# Patient Record
Sex: Female | Born: 1970 | Race: Black or African American | Hispanic: No | Marital: Single | Smoking: Former smoker
Health system: Southern US, Community
[De-identification: ages and names within clinical notes are randomized; demographics above are authoritative.]

## PROBLEM LIST (undated history)

## (undated) DIAGNOSIS — I10 Essential (primary) hypertension: Secondary | ICD-10-CM

## (undated) DIAGNOSIS — E119 Type 2 diabetes mellitus without complications: Secondary | ICD-10-CM

## (undated) DIAGNOSIS — M545 Low back pain, unspecified: Secondary | ICD-10-CM

## (undated) DIAGNOSIS — E876 Hypokalemia: Secondary | ICD-10-CM

## (undated) DIAGNOSIS — E7800 Pure hypercholesterolemia, unspecified: Secondary | ICD-10-CM

## (undated) HISTORY — PX: CHOLECYSTECTOMY: SHX55

## (undated) HISTORY — PX: KNEE SURGERY: SHX244

## (undated) HISTORY — DX: Low back pain, unspecified: M54.50

## (undated) HISTORY — PX: SPINE SURGERY: SHX786

## (undated) HISTORY — PX: NECK SURGERY: SHX720

## (undated) HISTORY — PX: ANKLE SURGERY: SHX546

---

## 2012-08-05 ENCOUNTER — Emergency Department (HOSPITAL_BASED_OUTPATIENT_CLINIC_OR_DEPARTMENT_OTHER)
Admission: EM | Admit: 2012-08-05 | Discharge: 2012-08-05 | Disposition: A | Discharge status: Home / Self Care | Payer: BC Managed Care – PPO | Attending: Emergency Medicine | Admitting: Emergency Medicine

## 2012-08-05 ENCOUNTER — Encounter (HOSPITAL_BASED_OUTPATIENT_CLINIC_OR_DEPARTMENT_OTHER): Payer: Self-pay

## 2012-08-05 DIAGNOSIS — Z880 Allergy status to penicillin: Secondary | ICD-10-CM | POA: Insufficient documentation

## 2012-08-05 DIAGNOSIS — F17200 Nicotine dependence, unspecified, uncomplicated: Secondary | ICD-10-CM | POA: Insufficient documentation

## 2012-08-05 DIAGNOSIS — I10 Essential (primary) hypertension: Secondary | ICD-10-CM

## 2012-08-05 DIAGNOSIS — F1010 Alcohol abuse, uncomplicated: Secondary | ICD-10-CM | POA: Insufficient documentation

## 2012-08-05 HISTORY — DX: Essential (primary) hypertension: I10

## 2012-08-05 NOTE — ED Notes (Signed)
Patient's medication list requested from [facility] pharmacy that she uses.  Pharmacy stated that they will fax it.

## 2012-08-05 NOTE — ED Notes (Signed)
Called Dr. Riley Nearing at the office--direct for Dr. Rulon Abide.

## 2012-08-05 NOTE — ED Provider Notes (Signed)
History     CSN: [id number]  Arrival date & time 08/05/12  4098   First MD Initiated Contact with Patient 08/05/12 [phone]      Chief Complaint  Patient presents with  . Hypertension    (Consider location/radiation/quality/duration/timing/severity/associated sxs/prior treatment) HPI Sierra Pearson is a 41 y.o. female presenting with hypertension.  This has been an ongoing problem for her - she recently saw her PCP and her medications were changed to nevbilol, lisinopril and chlorthalidone.  Pt says she has been taking her medications as prescribed.  She took her BP this morning and it was elevated, she was concerned with this and came to the ER. No pain. There are no alleviating or exacerbating factors.  No chest pain, shortness of breath or cough.  Past Medical History  Diagnosis Date  . Hypertension     Past Surgical History  Procedure Date  . Spine surgery     Family History  Problem Relation Age of Onset  . Cancer Mother     History  Substance Use Topics  . Smoking status: Current Everyday Smoker -- 0.5 packs/day for 20 years    Types: Cigarettes  . Smokeless tobacco: Not on file  . Alcohol Use: Yes     socially    OB History    Grav Para Term Preterm Abortions TAB SAB Ect Mult Living                  Review of Systems Patient denies any fevers or chills, changes in vision, earache, sore throat, neck pain or stiffness, chest pain or pressure, palpitations, syncope, dyspnea, cough, wheezing, abdominal pain, nausea, vomiting, diarrhea, melena, red bloody stools, frequency, dysuria, myalgias, arthralgias, back pain, recent trauma, rash, itching, skin lesions, easy bruising or bleeding, headache, seizures, numbness, tingling or weakness.   Allergies  Penicillins  Home Medications   Current Outpatient Rx  Name Route Sig Dispense Refill  . LISINOPRIL PO Oral Take by mouth every morning.      BP 192/118  Pulse 51  Temp 98 F (36.7 C) (Oral)  Resp 18  Ht 5'  5" (1.651 m)  Wt 195 lb (88.451 kg)  BMI 32.45 kg/m2  SpO2 100%  LMP 06/26/2012  Physical Exam VITAL SIGNS:   Filed Vitals:   08/05/12 0923  BP: 170/109  Pulse: 64  Temp:   Resp: 22   CONSTITUTIONAL: Awake, oriented, appears non-toxic HENT: Atraumatic, normocephalic, oral mucosa pink and moist, airway patent. Nares patent without drainage. External ears normal. EYES: Conjunctiva clear, EOMI, PERRLA NECK: Trachea midline, non-tender, supple CARDIOVASCULAR: Normal heart rate, Normal rhythm, No murmurs, rubs, gallops PULMONARY/CHEST: Clear to auscultation, no rhonchi, wheezes, or rales. Symmetrical breath sounds. Non-tender. ABDOMINAL: Non-distended, soft, non-tender - no rebound or guarding.  BS normal. NEUROLOGIC: Non-focal, moving all four extremities, no gross sensory or motor deficits. EXTREMITIES: No clubbing, cyanosis, or edema SKIN: Warm, Dry, No erythema, No rash  ED Course  Procedures (including critical care time)  Labs Reviewed - No data to display No results found.   No diagnosis found.    MDM  Sierra Pearson is a 41 y.o. female presents with asymptomatic hypertension - no tests are indicated at this time.  PT does not need any emergent stabilization.  Nothing in H&P to suggest end-organ dysfunction.  Discussed with Primary care physician -pt is non-compliant with medications and continues to smoke and binge drink.  Had a long discussion with patient on lifestyle modifications and the end result of long-standing  hypertension.  Pt has f/u next week.  DC to home stable, in good condition.        Jones Skene, MD 08/08/12 1224

## 2012-08-05 NOTE — ED Notes (Signed)
Pt reports having elevated bp.  Pt took BP this morning at 0630 in her right arm, 202/113.  Pt reports BP being elevated since this past Tuesday.  Pt has been taking BP everyday since.  Recommended by PCP to come to ED this morning.

## 2012-08-05 NOTE — ED Notes (Signed)
Pt reports being lightheaded.  Denies nausea or vomiting.  Pt reports having a headache, "its more like burning".  Not localized to one spot.  Pain 8/10. Nad.

## 2014-05-29 DIAGNOSIS — A630 Anogenital (venereal) warts: Secondary | ICD-10-CM | POA: Insufficient documentation

## 2014-05-29 DIAGNOSIS — K2960 Other gastritis without bleeding: Secondary | ICD-10-CM | POA: Insufficient documentation

## 2014-05-29 DIAGNOSIS — E785 Hyperlipidemia, unspecified: Secondary | ICD-10-CM | POA: Insufficient documentation

## 2014-05-29 DIAGNOSIS — M889 Osteitis deformans of unspecified bone: Secondary | ICD-10-CM | POA: Insufficient documentation

## 2014-05-29 DIAGNOSIS — L209 Atopic dermatitis, unspecified: Secondary | ICD-10-CM | POA: Insufficient documentation

## 2014-05-29 DIAGNOSIS — R531 Weakness: Secondary | ICD-10-CM | POA: Insufficient documentation

## 2014-05-29 DIAGNOSIS — R001 Bradycardia, unspecified: Secondary | ICD-10-CM | POA: Insufficient documentation

## 2014-05-29 DIAGNOSIS — M25569 Pain in unspecified knee: Secondary | ICD-10-CM | POA: Insufficient documentation

## 2014-05-29 DIAGNOSIS — L80 Vitiligo: Secondary | ICD-10-CM | POA: Insufficient documentation

## 2014-05-29 DIAGNOSIS — I773 Arterial fibromuscular dysplasia: Secondary | ICD-10-CM | POA: Insufficient documentation

## 2014-05-29 DIAGNOSIS — I10 Essential (primary) hypertension: Secondary | ICD-10-CM | POA: Insufficient documentation

## 2014-05-29 DIAGNOSIS — M24819 Other specific joint derangements of unspecified shoulder, not elsewhere classified: Secondary | ICD-10-CM | POA: Insufficient documentation

## 2014-05-29 DIAGNOSIS — M545 Low back pain, unspecified: Secondary | ICD-10-CM | POA: Insufficient documentation

## 2014-05-29 DIAGNOSIS — G8929 Other chronic pain: Secondary | ICD-10-CM | POA: Insufficient documentation

## 2014-05-29 DIAGNOSIS — R252 Cramp and spasm: Secondary | ICD-10-CM | POA: Insufficient documentation

## 2014-05-29 DIAGNOSIS — K219 Gastro-esophageal reflux disease without esophagitis: Secondary | ICD-10-CM | POA: Insufficient documentation

## 2014-05-29 DIAGNOSIS — R202 Paresthesia of skin: Secondary | ICD-10-CM | POA: Insufficient documentation

## 2014-05-29 DIAGNOSIS — N926 Irregular menstruation, unspecified: Secondary | ICD-10-CM | POA: Insufficient documentation

## 2014-05-29 DIAGNOSIS — R200 Anesthesia of skin: Secondary | ICD-10-CM | POA: Insufficient documentation

## 2014-05-29 DIAGNOSIS — F17200 Nicotine dependence, unspecified, uncomplicated: Secondary | ICD-10-CM | POA: Insufficient documentation

## 2014-05-29 DIAGNOSIS — G902 Horner's syndrome: Secondary | ICD-10-CM | POA: Insufficient documentation

## 2014-05-29 DIAGNOSIS — M25512 Pain in left shoulder: Secondary | ICD-10-CM | POA: Insufficient documentation

## 2014-05-29 DIAGNOSIS — L219 Seborrheic dermatitis, unspecified: Secondary | ICD-10-CM | POA: Insufficient documentation

## 2014-06-11 DIAGNOSIS — E669 Obesity, unspecified: Secondary | ICD-10-CM | POA: Insufficient documentation

## 2014-06-11 DIAGNOSIS — G4700 Insomnia, unspecified: Secondary | ICD-10-CM | POA: Insufficient documentation

## 2014-06-14 DIAGNOSIS — R739 Hyperglycemia, unspecified: Secondary | ICD-10-CM | POA: Insufficient documentation

## 2015-03-20 DIAGNOSIS — J069 Acute upper respiratory infection, unspecified: Secondary | ICD-10-CM | POA: Insufficient documentation

## 2015-04-04 ENCOUNTER — Other Ambulatory Visit: Payer: Self-pay | Admitting: Neurosurgery

## 2015-04-04 DIAGNOSIS — M4726 Other spondylosis with radiculopathy, lumbar region: Secondary | ICD-10-CM

## 2015-04-21 ENCOUNTER — Ambulatory Visit
Admission: RE | Admit: 2015-04-21 | Discharge: 2015-04-21 | Disposition: A | Discharge status: Home / Self Care | Payer: 59 | Source: Ambulatory Visit | Attending: Neurosurgery | Admitting: Neurosurgery

## 2015-04-21 DIAGNOSIS — M4726 Other spondylosis with radiculopathy, lumbar region: Secondary | ICD-10-CM

## 2015-09-28 ENCOUNTER — Encounter (HOSPITAL_BASED_OUTPATIENT_CLINIC_OR_DEPARTMENT_OTHER): Payer: Self-pay | Admitting: *Deleted

## 2015-09-28 ENCOUNTER — Emergency Department (HOSPITAL_BASED_OUTPATIENT_CLINIC_OR_DEPARTMENT_OTHER)
Admission: EM | Admit: 2015-09-28 | Discharge: 2015-09-28 | Disposition: A | Discharge status: Home / Self Care | Payer: 59 | Attending: Emergency Medicine | Admitting: Emergency Medicine

## 2015-09-28 DIAGNOSIS — Z720 Tobacco use: Secondary | ICD-10-CM | POA: Diagnosis not present

## 2015-09-28 DIAGNOSIS — S0990XA Unspecified injury of head, initial encounter: Secondary | ICD-10-CM | POA: Diagnosis not present

## 2015-09-28 DIAGNOSIS — R51 Headache: Secondary | ICD-10-CM

## 2015-09-28 DIAGNOSIS — Y998 Other external cause status: Secondary | ICD-10-CM | POA: Insufficient documentation

## 2015-09-28 DIAGNOSIS — Y9389 Activity, other specified: Secondary | ICD-10-CM | POA: Diagnosis not present

## 2015-09-28 DIAGNOSIS — S161XXA Strain of muscle, fascia and tendon at neck level, initial encounter: Secondary | ICD-10-CM | POA: Diagnosis not present

## 2015-09-28 DIAGNOSIS — Z79899 Other long term (current) drug therapy: Secondary | ICD-10-CM | POA: Diagnosis not present

## 2015-09-28 DIAGNOSIS — Y92410 Unspecified street and highway as the place of occurrence of the external cause: Secondary | ICD-10-CM | POA: Diagnosis not present

## 2015-09-28 DIAGNOSIS — Z880 Allergy status to penicillin: Secondary | ICD-10-CM | POA: Diagnosis not present

## 2015-09-28 DIAGNOSIS — I10 Essential (primary) hypertension: Secondary | ICD-10-CM | POA: Diagnosis not present

## 2015-09-28 DIAGNOSIS — S199XXA Unspecified injury of neck, initial encounter: Secondary | ICD-10-CM | POA: Diagnosis present

## 2015-09-28 DIAGNOSIS — R519 Headache, unspecified: Secondary | ICD-10-CM

## 2015-09-28 NOTE — ED Notes (Signed)
EDP discussed treatment at home, such as ibuprofen, heating pad
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
## 2015-09-28 NOTE — ED Notes (Signed)
Primarily c/o neck pain, soreness, radiates to top of shoulders, neuro exam per EDP was negative

## 2015-09-28 NOTE — ED Notes (Signed)
MD at bedside. 
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
## 2015-11-12 DIAGNOSIS — M542 Cervicalgia: Secondary | ICD-10-CM | POA: Insufficient documentation

## 2016-03-17 DIAGNOSIS — R200 Anesthesia of skin: Secondary | ICD-10-CM | POA: Insufficient documentation

## 2016-03-17 DIAGNOSIS — M5030 Other cervical disc degeneration, unspecified cervical region: Secondary | ICD-10-CM | POA: Insufficient documentation

## 2016-03-19 DIAGNOSIS — G9589 Other specified diseases of spinal cord: Secondary | ICD-10-CM | POA: Insufficient documentation

## 2016-03-19 DIAGNOSIS — Z981 Arthrodesis status: Secondary | ICD-10-CM | POA: Insufficient documentation

## 2017-11-03 ENCOUNTER — Other Ambulatory Visit: Payer: Self-pay

## 2017-11-03 ENCOUNTER — Emergency Department (HOSPITAL_BASED_OUTPATIENT_CLINIC_OR_DEPARTMENT_OTHER): Payer: 59

## 2017-11-03 ENCOUNTER — Emergency Department (HOSPITAL_BASED_OUTPATIENT_CLINIC_OR_DEPARTMENT_OTHER)
Admission: EM | Admit: 2017-11-03 | Discharge: 2017-11-03 | Disposition: A | Discharge status: Home / Self Care | Payer: 59 | Attending: Emergency Medicine | Admitting: Emergency Medicine

## 2017-11-03 ENCOUNTER — Encounter (HOSPITAL_BASED_OUTPATIENT_CLINIC_OR_DEPARTMENT_OTHER): Payer: Self-pay

## 2017-11-03 DIAGNOSIS — R0789 Other chest pain: Secondary | ICD-10-CM

## 2017-11-03 DIAGNOSIS — I10 Essential (primary) hypertension: Secondary | ICD-10-CM | POA: Diagnosis not present

## 2017-11-03 DIAGNOSIS — E876 Hypokalemia: Secondary | ICD-10-CM | POA: Diagnosis not present

## 2017-11-03 DIAGNOSIS — Z87891 Personal history of nicotine dependence: Secondary | ICD-10-CM | POA: Diagnosis not present

## 2017-11-03 DIAGNOSIS — Z79899 Other long term (current) drug therapy: Secondary | ICD-10-CM | POA: Diagnosis not present

## 2017-11-03 DIAGNOSIS — K2100 Gastro-esophageal reflux disease with esophagitis, without bleeding: Secondary | ICD-10-CM

## 2017-11-03 HISTORY — DX: Pure hypercholesterolemia, unspecified: E78.00

## 2017-11-03 HISTORY — DX: Hypokalemia: E87.6

## 2017-11-03 LAB — CBC
HCT: 38.2 % (ref 36.0–46.0)
Hemoglobin: 12.4 g/dL (ref 12.0–15.0)
MCH: 23.5 pg — ABNORMAL LOW (ref 26.0–34.0)
MCHC: 32.5 g/dL (ref 30.0–36.0)
MCV: 72.5 fL — ABNORMAL LOW (ref 78.0–100.0)
Platelets: 316 10*3/uL (ref 150–400)
RBC: 5.27 MIL/uL — ABNORMAL HIGH (ref 3.87–5.11)
RDW: 15.8 % — AB (ref 11.5–15.5)
WBC: 5.3 10*3/uL (ref 4.0–10.5)

## 2017-11-03 LAB — BASIC METABOLIC PANEL
Anion gap: 10 (ref 5–15)
BUN: 14 mg/dL (ref 6–20)
CO2: 26 mmol/L (ref 22–32)
CREATININE: 0.85 mg/dL (ref 0.44–1.00)
Calcium: 9.6 mg/dL (ref 8.9–10.3)
Chloride: 102 mmol/L (ref 101–111)
GFR calc Af Amer: >60 mL/min (ref >60)
Glucose, Bld: 90 mg/dL (ref 65–99)
Potassium: 3.2 mmol/L — ABNORMAL LOW (ref 3.5–5.1)
SODIUM: 138 mmol/L (ref 135–145)

## 2017-11-03 LAB — TROPONIN I: Troponin I: <0.03 ng/mL (ref <0.03)

## 2017-11-03 MED ORDER — PANTOPRAZOLE SODIUM 20 MG PO TBEC: 20.0000 mg | DELAYED_RELEASE_TABLET | Freq: Every day | ORAL | 0 refills | Status: DC

## 2017-11-03 MED ORDER — POTASSIUM CHLORIDE CRYS ER 20 MEQ PO TBCR
40.0000 meq | EXTENDED_RELEASE_TABLET | Freq: Once | ORAL | Status: AC
  Administered 2017-11-03: 40 meq via ORAL
  Filled 2017-11-03: qty 2

## 2017-11-03 MED ORDER — GI COCKTAIL ~~LOC~~
30.0000 mL | Freq: Once | ORAL | Status: AC
  Administered 2017-11-03: 30 mL via ORAL
  Filled 2017-11-03: qty 30

## 2017-11-03 NOTE — ED Notes (Signed)
ED Provider at bedside. 

## 2017-11-03 NOTE — ED Triage Notes (Signed)
C/o CP x today-NAD-steady gait

## 2017-11-03 NOTE — ED Notes (Signed)
Pt ambulated to XR at this time.

## 2017-11-03 NOTE — ED Provider Notes (Signed)
[place] EMERGENCY DEPARTMENT Provider Note   CSN: [id number] Arrival date & time: 11/03/17  1243     History   Chief Complaint Chief Complaint  Patient presents with  . Chest Pain    HPI Sierra Pearson is a 46 y.o. female.  HPI Patient presents with central chest pain that started this morning while driving to work.  States she had mild shortness of breath and cough.  No fever or chills.  Chest pain since improved.  Denies new lower extremity swelling or pain.  No recent extended travel or immobilization.  No personal family history of thromboembolic disease or coronary artery disease.  Patient states she does take diclofenac chronically for musculoskeletal pain. Past Medical History:  Diagnosis Date  . High cholesterol   . Hypertension   . Hypokalemia     There are no active problems to display for this patient.   Past Surgical History:  Procedure Laterality Date  . KNEE SURGERY    . NECK SURGERY    . SPINE SURGERY      OB History    No data available       Home Medications    Prior to Admission medications   Medication Sig Start Date End Date Taking? Authorizing Provider  ATORVASTATIN CALCIUM PO Take by mouth.   Yes [provider]  DICLOFENAC PO Take by mouth.   Yes [provider]  GABAPENTIN PO Take by mouth.   Yes [provider]  potassium chloride (K-DUR,KLOR-CON) 10 MEQ tablet Take 10 mEq by mouth 2 (two) times daily.   Yes [provider]  UNKNOWN TO PATIENT BP MED, MUSCLE RELAXANT   Yes [provider]  ketoconazole (NIZORAL) 2 % cream Apply topically daily.    [provider]  pantoprazole (PROTONIX) 20 MG tablet Take 1 tablet (20 mg total) by mouth daily. 11/03/17   Julianne Rice, MD    Family History Family History  Problem Relation Age of Onset  . Cancer Mother     Social History Social History   Tobacco Use  . Smoking status: Former Smoker    Packs/day: 0.50   Years: 20.00    Pack years: 10.00  . Smokeless tobacco: Never Used  Substance Use Topics  . Alcohol use: Yes    Comment: weekly  . Drug use: No     Allergies   Penicillins   Review of Systems Review of Systems  Constitutional: Negative for chills and fever.  HENT: Negative for congestion.   Respiratory: Positive for cough and shortness of breath.   Cardiovascular: Positive for chest pain. Negative for palpitations and leg swelling.  Gastrointestinal: Negative for abdominal pain, diarrhea, nausea and vomiting.  Musculoskeletal: Positive for back pain and myalgias. Negative for neck pain and neck stiffness.  Skin: Negative for rash and wound.  Neurological: Negative for dizziness, weakness, light-headedness, numbness and headaches.  All other systems reviewed and are negative.    Physical Exam Updated Vital Signs BP 138/89 (BP Location: Right Arm)   Pulse 70   Temp 97.8 F (36.6 C) (Oral)   Resp 18   Ht 5\' 5"  (1.651 m)   Wt 98.4 kg (217 lb)   SpO2 99%   BMI 36.11 kg/m   Physical Exam  Constitutional: She is oriented to person, place, and time. She appears well-developed and well-nourished. No distress.  HENT:  Head: Normocephalic and atraumatic.  Mouth/Throat: Oropharynx is clear and moist. No oropharyngeal exudate.  Eyes: EOM are normal. Pupils  are equal, round, and reactive to light.  Neck: Normal range of motion. Neck supple.  Cardiovascular: Normal rate and regular rhythm. Exam reveals no gallop and no friction rub.  No murmur heard. Pulmonary/Chest: Effort normal and breath sounds normal. No stridor. No respiratory distress. She has no wheezes. She has no rales. She exhibits no tenderness.  Abdominal: Soft. Bowel sounds are normal. There is no tenderness. There is no rebound and no guarding.  Musculoskeletal: Normal range of motion. She exhibits no edema or tenderness.  No lower extremity swelling, asymmetry or tenderness.  Distal pulses are 2+.    Lymphadenopathy:    She has no cervical adenopathy.  Neurological: She is alert and oriented to person, place, and time.  Skin: Skin is warm and dry. Capillary refill takes less than 2 seconds. No rash noted. She is not diaphoretic. No erythema.  Psychiatric: She has a normal mood and affect. Her behavior is normal.  Nursing note and vitals reviewed.    ED Treatments / Results  Labs (all labs ordered are listed, but only abnormal results are displayed) Labs Reviewed  BASIC METABOLIC PANEL - Abnormal; Notable for the following components:      Result Value   Potassium 3.2 (*)    All other components within normal limits  CBC - Abnormal; Notable for the following components:   RBC 5.27 (*)    MCV 72.5 (*)    MCH 23.5 (*)    RDW 15.8 (*)    All other components within normal limits  TROPONIN I    EKG  EKG Interpretation  Date/Time:  Wednesday November 03 2017 12:46:17 EST Ventricular Rate:  73 PR Interval:  150 QRS Duration: 80 QT Interval:  384 QTC Calculation: 423 R Axis:   28 Text Interpretation:  Normal sinus rhythm Nonspecific T wave abnormality Abnormal ECG Confirmed by Julianne Rice [phone]) on 11/03/2017 1:56:02 PM       Radiology Dg Chest 2 View  Result Date: 11/03/2017 CLINICAL DATA:  Onset of midsternal chest pain today. EXAM: CHEST  2 VIEW COMPARISON:  None. FINDINGS: The lungs are clear. Heart size is normal. No pneumothorax or pleural effusion. No bony abnormality. IMPRESSION: Normal chest. Electronically Signed   By: Inge Rise M.D.   On: 11/03/2017 13:07    Procedures Procedures (including critical care time)  Medications Ordered in ED Medications  gi cocktail (Maalox,Lidocaine,Donnatal) (30 mLs Oral Given 11/03/17 1439)  potassium chloride SA (K-DUR,KLOR-CON) CR tablet 40 mEq (40 mEq Oral Given 11/03/17 1438)     Initial Impression / Assessment and Plan / ED Course  I have reviewed the triage vital signs and the nursing  notes.  Pertinent labs & imaging results that were available during my care of the patient were reviewed by me and considered in my medical decision making (see chart for details).    EKG and troponin are normal.  Chest x-ray without acute findings.  Patient's chest pain is completely resolved after GI cocktail. Suspect gastrointestinal etiology.  Will start on PPI and have follow-up with gastroenterology.  Final Clinical Impressions(s) / ED Diagnoses   Final diagnoses:  Atypical chest pain  Gastroesophageal reflux disease with esophagitis  Hypokalemia    ED Discharge Orders        Ordered    pantoprazole (PROTONIX) 20 MG tablet  Daily     11/03/17 1457       Julianne Rice, MD 11/04/17 1530

## 2017-11-16 DIAGNOSIS — G5603 Carpal tunnel syndrome, bilateral upper limbs: Secondary | ICD-10-CM | POA: Insufficient documentation

## 2017-11-29 ENCOUNTER — Other Ambulatory Visit: Payer: Self-pay

## 2017-11-29 ENCOUNTER — Emergency Department (HOSPITAL_BASED_OUTPATIENT_CLINIC_OR_DEPARTMENT_OTHER): Payer: 59

## 2017-11-29 ENCOUNTER — Encounter (HOSPITAL_BASED_OUTPATIENT_CLINIC_OR_DEPARTMENT_OTHER): Payer: Self-pay | Admitting: *Deleted

## 2017-11-29 ENCOUNTER — Emergency Department (HOSPITAL_BASED_OUTPATIENT_CLINIC_OR_DEPARTMENT_OTHER)
Admission: EM | Admit: 2017-11-29 | Discharge: 2017-11-29 | Disposition: A | Discharge status: Home / Self Care | Payer: 59 | Attending: Physician Assistant | Admitting: Physician Assistant

## 2017-11-29 DIAGNOSIS — R05 Cough: Secondary | ICD-10-CM | POA: Diagnosis present

## 2017-11-29 DIAGNOSIS — I10 Essential (primary) hypertension: Secondary | ICD-10-CM | POA: Insufficient documentation

## 2017-11-29 DIAGNOSIS — Z87891 Personal history of nicotine dependence: Secondary | ICD-10-CM | POA: Insufficient documentation

## 2017-11-29 DIAGNOSIS — B9789 Other viral agents as the cause of diseases classified elsewhere: Secondary | ICD-10-CM

## 2017-11-29 DIAGNOSIS — Z79899 Other long term (current) drug therapy: Secondary | ICD-10-CM | POA: Diagnosis not present

## 2017-11-29 DIAGNOSIS — J069 Acute upper respiratory infection, unspecified: Secondary | ICD-10-CM | POA: Diagnosis not present

## 2017-11-29 MED ORDER — GUAIFENESIN-CODEINE 100-10 MG/5ML PO SOLN: 5.0000 mL | Freq: Four times a day (QID) | ORAL | 0 refills | Status: DC | PRN

## 2017-11-29 MED ORDER — BENZONATATE 100 MG PO CAPS: 100.0000 mg | ORAL_CAPSULE | Freq: Three times a day (TID) | ORAL | 0 refills | Status: DC | PRN

## 2017-11-29 NOTE — Discharge Instructions (Signed)
Please use these medications to help you with your symptoms.  We think this is likely viral.  If it goes longer than 6 days or you have any increase in fever or concerns please return immediately.

## 2017-11-29 NOTE — ED Triage Notes (Signed)
Cough x 3 days

## 2017-11-29 NOTE — ED Notes (Signed)
ED Provider at bedside. 

## 2017-11-29 NOTE — ED Provider Notes (Signed)
[place] EMERGENCY DEPARTMENT Provider Note   CSN: [id number] Arrival date & time: 11/29/17  1441     History   Chief Complaint Chief Complaint  Patient presents with  . Cough    HPI Sierra Pearson is a 46 y.o. female.  HPI   46 year old female presenting with cough congestion sore throat mild headaches.  Patient eating drinking normally.  No  Fevers.  No nausea no vomiting.  Has been going on for 24 hours.  Past Medical History:  Diagnosis Date  . High cholesterol   . Hypertension   . Hypokalemia     There are no active problems to display for this patient.   Past Surgical History:  Procedure Laterality Date  . KNEE SURGERY    . NECK SURGERY    . SPINE SURGERY      OB History    No data available       Home Medications    Prior to Admission medications   Medication Sig Start Date End Date Taking? Authorizing Provider  ATORVASTATIN CALCIUM PO Take by mouth.    [provider]  benzonatate (TESSALON PERLES) 100 MG capsule Take 1 capsule (100 mg total) by mouth 3 (three) times daily as needed for cough. 11/29/17   Illeana Edick Lyn, MD  DICLOFENAC PO Take by mouth.    [provider]  GABAPENTIN PO Take by mouth.    [provider]  guaiFENesin-codeine 100-10 MG/5ML syrup Take 5 mLs by mouth every 6 (six) hours as needed for cough. 11/29/17   Jaxsin Bottomley Lyn, MD  ketoconazole (NIZORAL) 2 % cream Apply topically daily.    [provider]  pantoprazole (PROTONIX) 20 MG tablet Take 1 tablet (20 mg total) by mouth daily. 11/03/17   Julianne Rice, MD  potassium chloride (K-DUR,KLOR-CON) 10 MEQ tablet Take 10 mEq by mouth 2 (two) times daily.    [provider]  UNKNOWN TO PATIENT BP MED, MUSCLE RELAXANT    [provider]    Family History Family History  Problem Relation Age of Onset  . Cancer Mother     Social History Social History   Tobacco Use  . Smoking status:  Former Smoker    Packs/day: 0.50    Years: 20.00    Pack years: 10.00  . Smokeless tobacco: Never Used  Substance Use Topics  . Alcohol use: Yes    Comment: weekly  . Drug use: No     Allergies   Penicillins   Review of Systems Review of Systems  Constitutional: Positive for fatigue. Negative for activity change.  HENT: Positive for congestion.   Respiratory: Positive for cough. Negative for shortness of breath.   Cardiovascular: Negative for chest pain.  Gastrointestinal: Negative for abdominal pain.  All other systems reviewed and are negative.    Physical Exam Updated Vital Signs BP (!) 148/100 (BP Location: Right Arm)   Pulse 82   Temp 99 F (37.2 C) (Oral)   Resp 18   Ht 5\' 5"  (1.651 m)   Wt 98.4 kg (217 lb)   SpO2 98%   BMI 36.11 kg/m   Physical Exam  Constitutional: She is oriented to person, place, and time. She appears well-developed and well-nourished.  HENT:  Head: Normocephalic and atraumatic.  Right Ear: External ear normal.  Left Ear: External ear normal.  Bilateral TM normal.  No erythema to posterior pharynx.  Eyes: Conjunctivae and EOM are normal. Pupils are equal, round, and reactive to light.  Right eye exhibits no discharge. Left eye exhibits no discharge.  Cardiovascular: Normal rate, regular rhythm and normal heart sounds.  No murmur heard. Pulmonary/Chest: Effort normal and breath sounds normal. She has no wheezes. She has no rales.  Abdominal: Soft. She exhibits no distension. There is no tenderness.  Neurological: She is oriented to person, place, and time.  Skin: Skin is warm and dry. She is not diaphoretic.  Psychiatric: She has a normal mood and affect.  Nursing note and vitals reviewed.    ED Treatments / Results  Labs (all labs ordered are listed, but only abnormal results are displayed) Labs Reviewed - No data to display  EKG  EKG Interpretation None       Radiology Dg Chest 2 View  Result Date:  11/29/2017 CLINICAL DATA:  Patient with cough.  Chest pain. EXAM: CHEST  2 VIEW COMPARISON:  Chest radiograph 11/03/2017. FINDINGS: Cervical spinal fusion hardware. Stable cardiac and mediastinal contours. No consolidative pulmonary opacities. No pleural effusion or pneumothorax. Thoracic spine degenerative changes. Cholecystectomy clips. IMPRESSION: No acute cardiopulmonary process. Electronically Signed   By: Lovey Newcomer M.D.   On: 11/29/2017 15:09    Procedures Procedures (including critical care time)  Medications Ordered in ED Medications - No data to display   Initial Impression / Assessment and Plan / ED Course  I have reviewed the triage vital signs and the nursing notes.  Pertinent labs & imaging results that were available during my care of the patient were reviewed by me and considered in my medical decision making (see chart for details).    46 year old female presenting with cough congestion sore throat mild headaches.  Patient eating drinking normally.  No  Fevers.  No nausea no vomiting.  Has been going on for 24 hours.  6:10 PM X-ray negative. nontoxic appearing.  Will discharge with symptomatic care for viral URI.  Final Clinical Impressions(s) / ED Diagnoses   Final diagnoses:  Viral URI with cough    ED Discharge Orders        Ordered    guaiFENesin-codeine 100-10 MG/5ML syrup  Every 6 hours PRN     11/29/17 1808    benzonatate (TESSALON PERLES) 100 MG capsule  3 times daily PRN     11/29/17 1808       Emil Klassen, Fredia Sorrow, MD 11/29/17 1811

## 2018-01-06 DIAGNOSIS — M5136 Other intervertebral disc degeneration, lumbar region: Secondary | ICD-10-CM | POA: Insufficient documentation

## 2018-01-06 DIAGNOSIS — M47816 Spondylosis without myelopathy or radiculopathy, lumbar region: Secondary | ICD-10-CM | POA: Insufficient documentation

## 2018-01-06 DIAGNOSIS — M5416 Radiculopathy, lumbar region: Secondary | ICD-10-CM | POA: Insufficient documentation

## 2018-07-09 ENCOUNTER — Other Ambulatory Visit: Payer: Self-pay

## 2018-07-09 ENCOUNTER — Emergency Department (HOSPITAL_BASED_OUTPATIENT_CLINIC_OR_DEPARTMENT_OTHER)
Admission: EM | Admit: 2018-07-09 | Discharge: 2018-07-09 | Disposition: A | Discharge status: Home / Self Care | Payer: 59 | Attending: Emergency Medicine | Admitting: Emergency Medicine

## 2018-07-09 ENCOUNTER — Emergency Department (HOSPITAL_BASED_OUTPATIENT_CLINIC_OR_DEPARTMENT_OTHER): Payer: 59

## 2018-07-09 ENCOUNTER — Encounter (HOSPITAL_BASED_OUTPATIENT_CLINIC_OR_DEPARTMENT_OTHER): Payer: Self-pay

## 2018-07-09 DIAGNOSIS — I10 Essential (primary) hypertension: Secondary | ICD-10-CM | POA: Diagnosis not present

## 2018-07-09 DIAGNOSIS — Z79899 Other long term (current) drug therapy: Secondary | ICD-10-CM | POA: Insufficient documentation

## 2018-07-09 DIAGNOSIS — Y929 Unspecified place or not applicable: Secondary | ICD-10-CM | POA: Insufficient documentation

## 2018-07-09 DIAGNOSIS — Z87891 Personal history of nicotine dependence: Secondary | ICD-10-CM | POA: Insufficient documentation

## 2018-07-09 DIAGNOSIS — Y9301 Activity, walking, marching and hiking: Secondary | ICD-10-CM | POA: Insufficient documentation

## 2018-07-09 DIAGNOSIS — Y999 Unspecified external cause status: Secondary | ICD-10-CM | POA: Diagnosis not present

## 2018-07-09 DIAGNOSIS — S82831A Other fracture of upper and lower end of right fibula, initial encounter for closed fracture: Secondary | ICD-10-CM | POA: Insufficient documentation

## 2018-07-09 DIAGNOSIS — S8991XA Unspecified injury of right lower leg, initial encounter: Secondary | ICD-10-CM | POA: Diagnosis present

## 2018-07-09 DIAGNOSIS — W0110XA Fall on same level from slipping, tripping and stumbling with subsequent striking against unspecified object, initial encounter: Secondary | ICD-10-CM | POA: Diagnosis not present

## 2018-07-09 MED ORDER — HYDROCODONE-ACETAMINOPHEN 5-325 MG PO TABS: 1.0000 | ORAL_TABLET | Freq: Four times a day (QID) | ORAL | 0 refills | Status: DC | PRN

## 2018-07-09 MED ORDER — HYDROCODONE-ACETAMINOPHEN 5-325 MG PO TABS
1.0000 | ORAL_TABLET | Freq: Once | ORAL | Status: AC
  Administered 2018-07-09: 1 via ORAL
  Filled 2018-07-09: qty 1

## 2018-07-09 NOTE — ED Notes (Signed)
PMS intact before and after. Pt tolerated well. All questions answered. 

## 2018-07-09 NOTE — ED Triage Notes (Signed)
Pt reports mechanical fall earlier in the evening, she now c/o 10/10 right ankle pain and right knee pain. No obvious deformity observed. Pt denies loc. Pt denies hitting her head. Pt A+OX4.

## 2018-07-09 NOTE — ED Provider Notes (Addendum)
[facility] Provider Note: Georgena Spurling, MD, FACEP  CSN: [id number] MRN: [id number] ARRIVAL: 07/09/18 at [place]: [place]  Ankle Injury (Right)   HISTORY OF PRESENT ILLNESS  07/09/18 2:07 AM Lenyx Boody is a 47 y.o. female who fell at home approximately 2-1/2 hours ago.  She is having pain in her right knee and right ankle.  There is swelling in her right ankle.  She rates her pain as a 10 out of 10, worse with movement or palpation.  She denies head injury, neck pain or back pain.  An ice pack was applied in triage.  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   Past Medical History:  Diagnosis Date  . High cholesterol   . Hypertension   . Hypokalemia     Past Surgical History:  Procedure Laterality Date  . KNEE SURGERY    . NECK SURGERY    . SPINE SURGERY      Family History  Problem Relation Age of Onset  . Cancer Mother     Social History   Tobacco Use  . Smoking status: Former Smoker    Packs/day: 0.50    Years: 20.00    Pack years: 10.00  . Smokeless tobacco: Never Used  Substance Use Topics  . Alcohol use: Yes    Comment: weekly  . Drug use: No    Prior to Admission medications   Medication Sig Start Date End Date Taking? Authorizing Provider  ATORVASTATIN CALCIUM PO Take by mouth.    [provider]  benzonatate (TESSALON PERLES) 100 MG capsule Take 1 capsule (100 mg total) by mouth 3 (three) times daily as needed for cough. 11/29/17   Mackuen, Courteney Lyn, MD  DICLOFENAC PO Take by mouth.    [provider]  GABAPENTIN PO Take by mouth.    [provider]  guaiFENesin-codeine 100-10 MG/5ML syrup Take 5 mLs by mouth every 6 (six) hours as needed for cough. 11/29/17   Mackuen, Courteney Lyn, MD  ketoconazole (NIZORAL) 2 % cream Apply topically daily.    [provider]  pantoprazole (PROTONIX) 20 MG tablet Take 1 tablet (20  mg total) by mouth daily. 11/03/17   Julianne Rice, MD  potassium chloride (K-DUR,KLOR-CON) 10 MEQ tablet Take 10 mEq by mouth 2 (two) times daily.    [provider]  UNKNOWN TO PATIENT BP MED, MUSCLE RELAXANT    [provider]    Allergies Penicillins   REVIEW OF SYSTEMS  Negative except as noted here or in the History of Present Illness.   PHYSICAL EXAMINATION  Initial Vital Signs Blood pressure (!) 136/92, pulse 88, temperature 98.1 F (36.7 C), temperature source Oral, resp. rate 16, SpO2 96 %.  Examination General: Well-developed, well-nourished female in no acute distress; appearance consistent with age of record HENT: normocephalic; atraumatic Eyes: pupils equal, round and reactive to light; extraocular muscles intact Neck: supple; nontender Heart: regular rate and rhythm Lungs: clear to auscultation bilaterally Abdomen: soft; nondistended; nontender; bowel sounds present Extremities: No deformity; pulses normal; tenderness, swelling and decreased range of motion of right ankle, right foot distally neurovascularly intact with intact tendon function; tenderness and decreased range of motion of right knee without swelling Neurologic: Awake, alert and oriented; motor function intact in all extremities and symmetric; no facial droop Skin: Warm and dry Psychiatric: Normal mood and affect   RESULTS  Summary of this visit's  results, reviewed by myself:   EKG Interpretation  Date/Time:    Ventricular Rate:    PR Interval:    QRS Duration:   QT Interval:    QTC Calculation:   R Axis:     Text Interpretation:        Laboratory Studies: No results found for this or any previous visit (from the past 24 hour(s)). Imaging Studies: Dg Ankle Complete Right  Result Date: 07/09/2018 CLINICAL DATA:  Ankle twisting injury EXAM: RIGHT ANKLE - COMPLETE 3+ VIEW COMPARISON:  None. FINDINGS: There is an oblique fracture of the distal right fibula that is  minimally posteriorly displaced. There is mild widening of the tibia-fibular syndesmosis. Moderate circumferential soft tissue swelling. No tibial fracture. IMPRESSION: Oblique fracture of the distal right fibula with mild widening of the tibia-fibular syndesmosis. Electronically Signed   By: Ulyses Jarred M.D.   On: 07/09/2018 03:21   Dg Knee Complete 4 Views Right  Result Date: 07/09/2018 CLINICAL DATA:  Twisting injury to the right ankle, with right knee pain. Initial encounter. EXAM: RIGHT KNEE - COMPLETE 4+ VIEW COMPARISON:  Right knee radiographs performed 11/13/2015 FINDINGS: There is no evidence of fracture or dislocation. The joint spaces are preserved. No significant degenerative change is seen; the patellofemoral joint is grossly unremarkable in appearance. No significant joint effusion is seen. The visualized soft tissues are normal in appearance. IMPRESSION: No evidence of fracture or dislocation. Electronically Signed   By: Garald Balding M.D.   On: 07/09/2018 03:30    ED COURSE and MDM  Nursing notes and initial vitals signs, including pulse oximetry, reviewed.  Vitals:   07/09/18 0148  BP: (!) 136/92  Pulse: 88  Resp: 16  Temp: 98.1 F (36.7 C)  TempSrc: Oral  SpO2: 96%   Will place in splint and crutches and refer to orthopedics.   Consultation with the [facility] state controlled substances database reveals the patient has received 1 prescription for tramadol in the past 2 years.  PROCEDURES    ED DIAGNOSES     ICD-10-CM   1. Other closed fracture of distal end of right fibula, initial encounter Q59.563O        Shanon Rosser, MD 07/09/18 0334    Shanon Rosser, MD 07/09/18 [phone]

## 2018-07-11 ENCOUNTER — Emergency Department (HOSPITAL_BASED_OUTPATIENT_CLINIC_OR_DEPARTMENT_OTHER)
Admission: EM | Admit: 2018-07-11 | Discharge: 2018-07-11 | Disposition: A | Discharge status: Home / Self Care | Payer: 59 | Attending: Emergency Medicine | Admitting: Emergency Medicine

## 2018-07-11 ENCOUNTER — Encounter (HOSPITAL_BASED_OUTPATIENT_CLINIC_OR_DEPARTMENT_OTHER): Payer: Self-pay | Admitting: Emergency Medicine

## 2018-07-11 ENCOUNTER — Other Ambulatory Visit: Payer: Self-pay

## 2018-07-11 ENCOUNTER — Emergency Department (HOSPITAL_BASED_OUTPATIENT_CLINIC_OR_DEPARTMENT_OTHER): Payer: 59

## 2018-07-11 ENCOUNTER — Encounter (INDEPENDENT_AMBULATORY_CARE_PROVIDER_SITE_OTHER): Payer: Self-pay

## 2018-07-11 DIAGNOSIS — W010XXD Fall on same level from slipping, tripping and stumbling without subsequent striking against object, subsequent encounter: Secondary | ICD-10-CM | POA: Diagnosis not present

## 2018-07-11 DIAGNOSIS — S82831D Other fracture of upper and lower end of right fibula, subsequent encounter for closed fracture with routine healing: Secondary | ICD-10-CM | POA: Insufficient documentation

## 2018-07-11 DIAGNOSIS — S82831A Other fracture of upper and lower end of right fibula, initial encounter for closed fracture: Secondary | ICD-10-CM

## 2018-07-11 DIAGNOSIS — M25571 Pain in right ankle and joints of right foot: Secondary | ICD-10-CM | POA: Diagnosis present

## 2018-07-11 MED ORDER — ACETAMINOPHEN 325 MG PO TABS
ORAL_TABLET | ORAL | Status: AC
  Filled 2018-07-11: qty 2

## 2018-07-11 MED ORDER — IBUPROFEN 400 MG PO TABS
600.0000 mg | ORAL_TABLET | Freq: Once | ORAL | Status: AC
  Administered 2018-07-11: 600 mg via ORAL
  Filled 2018-07-11: qty 1

## 2018-07-11 NOTE — ED Triage Notes (Signed)
Patient states that the splint on her right ankle was too tight and it was squeezing her circulation out. Patient would like her ankle relooked at

## 2018-07-11 NOTE — ED Notes (Addendum)
Error

## 2018-07-11 NOTE — ED Notes (Signed)
ED Provider at bedside. 

## 2018-07-11 NOTE — Discharge Instructions (Signed)
Please remain non-weightbearing and use crutches. Elevate your leg as often as possible above the level of your heart to decrease swelling. Follow up with your orthopedist tomorrow during your appointment.  If you are unable to follow-up with your orthopedist I provided you with information for wanting [facility]. If you develop worsening or new concerning symptoms you can return to the emergency department for re-evaluation.

## 2018-07-11 NOTE — ED Provider Notes (Signed)
[facility] EMERGENCY DEPARTMENT Provider Note   CSN: [id number] Arrival date & time: 07/11/18  1803     History   Chief Complaint No chief complaint on file.   HPI Sierra Pearson is a 47 y.o. female.  HPI  Sierra Pearson is a 47yo female with no significant past medical history who presents to the emergency department for evaluation of recheck of her right ankle fracture.  Patient was seen in the ED 2 days ago after a mechanical fall and was found to have right distal fibular fracture.  She was discharged with instructions to follow-up with orthopedics and placed in a posterior splint with crutches.  Patient states that she cannot use crutches because she has chronic pain in her left leg.  She has been weightbearing on the ball of her right foot despite instructions against weightbearing and removed the splint today because the fiberglass was irritating her ankle. She reports increased swelling since then. Has appointment with orthopedics tomorrow.  Has tingling sensation over the right ankle, denies loss of sensation.  She denies fevers, chills, weakness, break in skin.  No knee pain.  Past Medical History:  Diagnosis Date  . High cholesterol   . Hypertension   . Hypokalemia     There are no active problems to display for this patient.   Past Surgical History:  Procedure Laterality Date  . KNEE SURGERY    . NECK SURGERY    . SPINE SURGERY       OB History   None      Home Medications    Prior to Admission medications   Medication Sig Start Date End Date Taking? Authorizing Provider  ATORVASTATIN CALCIUM PO Take by mouth.    [provider]  DICLOFENAC PO Take by mouth.    [provider]  GABAPENTIN PO Take by mouth.    [provider]  HYDROcodone-acetaminophen (NORCO) 5-325 MG tablet Take 1 tablet by mouth every 6 (six) hours as needed for severe pain. 07/09/18   Molpus, John, MD  ketoconazole (NIZORAL) 2 % cream Apply  topically daily.    [provider]  pantoprazole (PROTONIX) 20 MG tablet Take 1 tablet (20 mg total) by mouth daily. 11/03/17   Julianne Rice, MD  potassium chloride (K-DUR,KLOR-CON) 10 MEQ tablet Take 10 mEq by mouth 2 (two) times daily.    [provider]  UNKNOWN TO PATIENT BP MED, MUSCLE RELAXANT    [provider]    Family History Family History  Problem Relation Age of Onset  . Cancer Mother     Social History Social History   Tobacco Use  . Smoking status: Former Smoker    Packs/day: 0.50    Years: 20.00    Pack years: 10.00  . Smokeless tobacco: Never Used  Substance Use Topics  . Alcohol use: Yes    Comment: weekly  . Drug use: No     Allergies   Penicillins   Review of Systems Review of Systems  Constitutional: Negative for chills and fever.  Musculoskeletal: Positive for arthralgias (right ankle) and joint swelling (right ankle).  Skin: Negative for color change and wound.  Neurological: Positive for numbness (tingling in right ankle, denies loss of sensation). Negative for weakness.     Physical Exam Updated Vital Signs BP (!) 130/93 (BP Location: Right Arm)   Pulse 77   Temp 98.3 F (36.8 C) (Oral)   Resp 18   Ht 5\' 5"  (1.651 m)  Wt 98.4 kg   SpO2 100%   BMI 36.10 kg/m   Physical Exam  Constitutional: She is oriented to person, place, and time. She appears well-developed and well-nourished. No distress.  HENT:  Head: Normocephalic and atraumatic.  Eyes: Right eye exhibits no discharge. Left eye exhibits no discharge.  Pulmonary/Chest: Effort normal. No respiratory distress.  Musculoskeletal:  Right ankle grossly swollen. No erythema, warmth or break in skin. Acutely tender to palpation over the lateral malleolus and lateral distal shin. Achilles intact. Limited ankle dorsiflexion/plantarflexion due to pain. DP pulses 2+ bilaterally. Sensation to light touch intact beyond ankle.   Neurological: She is alert and  oriented to person, place, and time. Coordination normal.  Skin: Skin is warm and dry. Capillary refill takes less than 2 seconds. She is not diaphoretic.  Psychiatric: She has a normal mood and affect. Her behavior is normal.  Nursing note and vitals reviewed.    ED Treatments / Results  Labs (all labs ordered are listed, but only abnormal results are displayed) Labs Reviewed - No data to display  EKG None  Radiology Dg Ankle Complete Right  Result Date: 07/11/2018 CLINICAL DATA:  More pain and swelling.  Distal fibula fracture. EXAM: RIGHT ANKLE - COMPLETE 3+ VIEW COMPARISON:  07/09/2018 FINDINGS: Similar appearance of the oblique distal fibula fracture without evidence for further displacement of the fracture fragments. Lateral soft tissue swelling has become mildly more prominent in the interval. IMPRESSION: Stable appearance oblique fracture distal fibula. Electronically Signed   By: Misty Stanley M.D.   On: 07/11/2018 19:23    Procedures Procedures (including critical care time)  Medications Ordered in ED Medications  ibuprofen (ADVIL,MOTRIN) tablet 600 mg (600 mg Oral Given 07/11/18 2145)     Initial Impression / Assessment and Plan / ED Course  I have reviewed the triage vital signs and the nursing notes.  Pertinent labs & imaging results that were available during my care of the patient were reviewed by me and considered in my medical decision making (see chart for details).    Patient with known right distal fibular fracture presents to the emergency department for evaluation of worsening pain and swelling after weightbearing on the splinted foot.  She has a follow-up appointment with orthopedics tomorrow.  X-ray right ankle appears stable from previous. Foot neurovascularly intact on exam. Waiting to hear back from Orthopedic on call Dr. Doran Durand. Sign out given at shift change to PA Alferd Apa for disposition after discussion with ortho.    Final Clinical  Impressions(s) / ED Diagnoses   Final diagnoses:  Other closed fracture of distal end of right fibula, initial encounter    ED Discharge Orders    None       Glyn Ade, PA-C 07/20/18 0128    Deno Etienne, DO 07/20/18 1955

## 2018-07-11 NOTE — ED Provider Notes (Signed)
Care assumed from Lorelle Formosa, PA-C at shift change with consult to orthopedics pending. Please see her note for further details.   In brief, this patient is a 47 y.o. female who presents to the emergent department today for check of her right ankle.  Patient was seen here 2 days ago after mechanical fall and found to have a right distal fibular fracture.  She was placed in posterior splint discharged nonweightbearing with crutches.  She states she cannot use crutches due to chronic pain in her left leg is bearing weight on her right foot.  She removed her fiberglass splint today. She reports increased pain and swelling. She does have appointment with orthopedist, Dr. Berenice Primas, tomorrow. Xray done in department shows stable fracture but there is question of increased medial clear space.   PLAN: Discuss with orthopedics xray findings of unstable ankle fracture.   MDM:  Spoke with orthopedist, Dr. Wynelle Link, who recommended reapply splint, and follow-up with her orthopedist tomorrow. Does not feel she needs evaluation tonight. If patient is unable to see her orthopedist tomorrow, recommended her follow-up with Dr. Doran Durand.  My exam patient's compartments are soft and she is neurovascular intact.  Patient updated on plan is agreement with plan. I advised the patient to follow-up with orthopedist tomorrow. Specific return precautions discussed. Time was given for all questions to be answered. The patient verbalized understanding and agreement with plan. The patient appears safe for discharge home.  SPLINT APPLICATION Date/Time: 07:37 PM Authorized by: Jillyn Ledger Consent: Verbal consent obtained. Risks and benefits: risks, benefits and alternatives were discussed Consent given by: patient Splint applied by: orthopedic technician Location details: right ankle Splint type: posterior splint with stirrups Supplies used: posterior splint with stirrups.  Post-procedure: The splinted body part was  neurovascularly unchanged following the procedure. Patient tolerance: Patient tolerated the procedure well with no immediate complications.   1. Other closed fracture of distal end of right fibula, initial encounter      Lorelle Gibbs 07/11/18 [place], DO 07/13/18 2301

## 2018-07-12 DIAGNOSIS — S82841A Displaced bimalleolar fracture of right lower leg, initial encounter for closed fracture: Secondary | ICD-10-CM | POA: Insufficient documentation

## 2018-07-18 DIAGNOSIS — S82891D Other fracture of right lower leg, subsequent encounter for closed fracture with routine healing: Secondary | ICD-10-CM | POA: Insufficient documentation

## 2018-10-04 DIAGNOSIS — M533 Sacrococcygeal disorders, not elsewhere classified: Secondary | ICD-10-CM | POA: Insufficient documentation

## 2018-10-24 DIAGNOSIS — S83511A Sprain of anterior cruciate ligament of right knee, initial encounter: Secondary | ICD-10-CM | POA: Insufficient documentation

## 2018-11-11 IMAGING — DX DG KNEE COMPLETE 4+V*R*
1 series · 1 of 1 positions shown · non-contrast
Comparison: Right knee radiographs performed 11/13/2015

CLINICAL DATA: Twisting injury to the right ankle, with right knee
pain. Initial encounter.

EXAM:
RIGHT KNEE - COMPLETE 4+ VIEW

[knee lat]
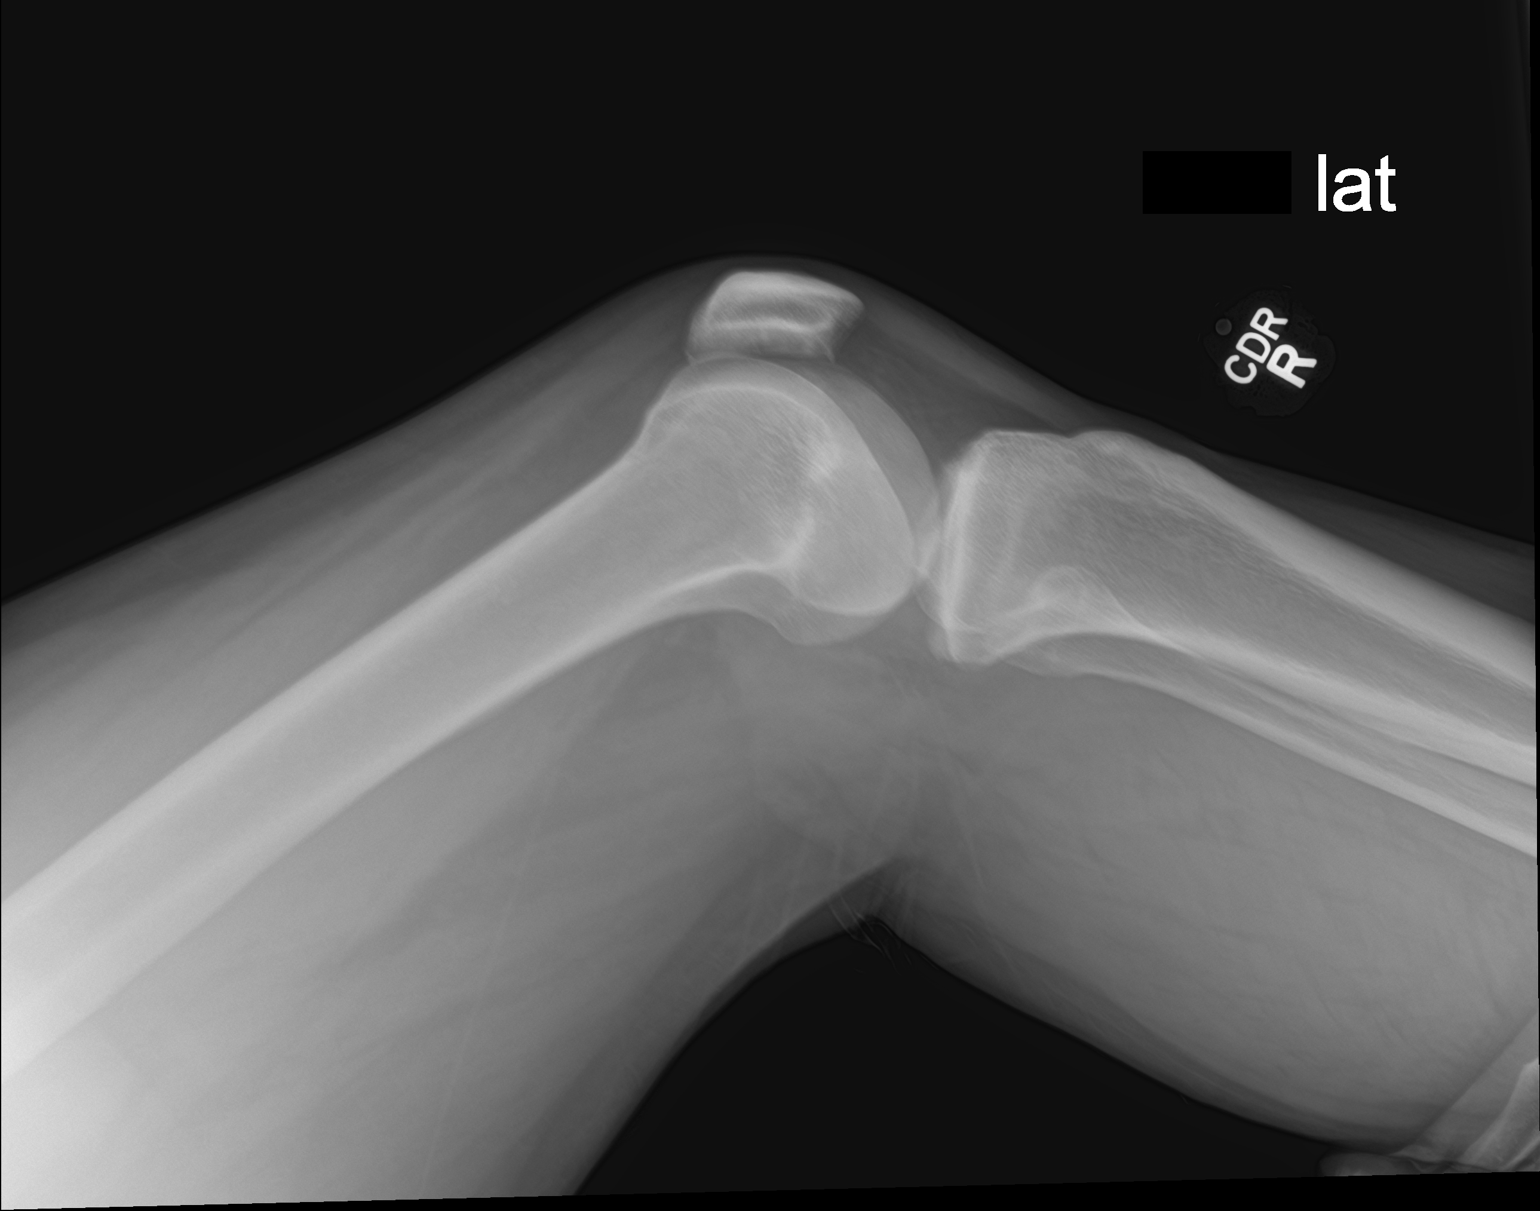

[1 of 1 positions shown; findings below may reference images not displayed]

FINDINGS: There is no evidence of fracture or dislocation. The joint spaces
are preserved. No significant degenerative change is seen; the
patellofemoral joint is grossly unremarkable in appearance.

No significant joint effusion is seen. The visualized soft tissues
are normal in appearance.
IMPRESSION: No evidence of fracture or dislocation.

## 2020-01-17 ENCOUNTER — Encounter: Payer: Self-pay | Admitting: Gastroenterology

## 2020-02-19 DIAGNOSIS — F40240 Claustrophobia: Secondary | ICD-10-CM | POA: Insufficient documentation

## 2020-03-18 ENCOUNTER — Encounter: Payer: 59 | Admitting: Gastroenterology

## 2020-03-28 ENCOUNTER — Encounter: Payer: 59 | Admitting: Gastroenterology

## 2020-05-14 ENCOUNTER — Encounter (HOSPITAL_BASED_OUTPATIENT_CLINIC_OR_DEPARTMENT_OTHER): Payer: Self-pay | Admitting: Emergency Medicine

## 2020-05-14 ENCOUNTER — Emergency Department (HOSPITAL_BASED_OUTPATIENT_CLINIC_OR_DEPARTMENT_OTHER)
Admission: EM | Admit: 2020-05-14 | Discharge: 2020-05-14 | Disposition: A | Discharge status: Home / Self Care | Payer: 59 | Attending: Emergency Medicine | Admitting: Emergency Medicine

## 2020-05-14 ENCOUNTER — Other Ambulatory Visit: Payer: Self-pay

## 2020-05-14 ENCOUNTER — Emergency Department (HOSPITAL_BASED_OUTPATIENT_CLINIC_OR_DEPARTMENT_OTHER): Payer: 59

## 2020-05-14 DIAGNOSIS — R05 Cough: Secondary | ICD-10-CM | POA: Diagnosis not present

## 2020-05-14 DIAGNOSIS — I10 Essential (primary) hypertension: Secondary | ICD-10-CM | POA: Insufficient documentation

## 2020-05-14 DIAGNOSIS — R0789 Other chest pain: Secondary | ICD-10-CM

## 2020-05-14 DIAGNOSIS — Z91040 Latex allergy status: Secondary | ICD-10-CM | POA: Insufficient documentation

## 2020-05-14 DIAGNOSIS — Z87891 Personal history of nicotine dependence: Secondary | ICD-10-CM | POA: Insufficient documentation

## 2020-05-14 DIAGNOSIS — R0602 Shortness of breath: Secondary | ICD-10-CM | POA: Insufficient documentation

## 2020-05-14 DIAGNOSIS — G8929 Other chronic pain: Secondary | ICD-10-CM | POA: Insufficient documentation

## 2020-05-14 DIAGNOSIS — Z880 Allergy status to penicillin: Secondary | ICD-10-CM | POA: Diagnosis not present

## 2020-05-14 LAB — BASIC METABOLIC PANEL
Anion gap: 11 (ref 5–15)
BUN: 23 mg/dL — ABNORMAL HIGH (ref 6–20)
CO2: 26 mmol/L (ref 22–32)
Calcium: 9.6 mg/dL (ref 8.9–10.3)
Chloride: 100 mmol/L (ref 98–111)
Creatinine, Ser: 0.99 mg/dL (ref 0.44–1.00)
GFR calc Af Amer: >60 mL/min (ref >60)
GFR calc non Af Amer: >60 mL/min (ref >60)
Glucose, Bld: 122 mg/dL — ABNORMAL HIGH (ref 70–99)
Potassium: 4.2 mmol/L (ref 3.5–5.1)
Sodium: 137 mmol/L (ref 135–145)

## 2020-05-14 LAB — CBC
HCT: 39.1 % (ref 36.0–46.0)
Hemoglobin: 12.1 g/dL (ref 12.0–15.0)
MCH: 23.3 pg — ABNORMAL LOW (ref 26.0–34.0)
MCHC: 30.9 g/dL (ref 30.0–36.0)
MCV: 75.2 fL — ABNORMAL LOW (ref 80.0–100.0)
Platelets: 305 10*3/uL (ref 150–400)
RBC: 5.2 MIL/uL — ABNORMAL HIGH (ref 3.87–5.11)
RDW: 15.7 % — ABNORMAL HIGH (ref 11.5–15.5)
WBC: 3.7 10*3/uL — ABNORMAL LOW (ref 4.0–10.5)
nRBC: 0 % (ref 0.0–0.2)

## 2020-05-14 LAB — TROPONIN I (HIGH SENSITIVITY)
Troponin I (High Sensitivity): 4 ng/L (ref <18)
Troponin I (High Sensitivity): 4 ng/L (ref <18)

## 2020-05-14 MED ORDER — METHOCARBAMOL 500 MG PO TABS
500.0000 mg | ORAL_TABLET | Freq: Once | ORAL | Status: AC
Start: 2020-05-14 — End: 2020-05-14
  Administered 2020-05-14: 500 mg via ORAL
  Filled 2020-05-14: qty 1

## 2020-05-14 MED ORDER — ALUM & MAG HYDROXIDE-SIMETH 200-200-20 MG/5ML PO SUSP
30.0000 mL | Freq: Once | ORAL | Status: AC
Start: 2020-05-14 — End: 2020-05-14
  Administered 2020-05-14: 30 mL via ORAL
  Filled 2020-05-14: qty 30

## 2020-05-14 NOTE — ED Notes (Signed)
Pt ambulated from lobby to room 9

## 2020-05-14 NOTE — ED Notes (Signed)
ED Provider at bedside. 

## 2020-05-14 NOTE — ED Triage Notes (Signed)
Presents with c/o intermittent chest pain for 2 days 6/10 pt reports mscp as burning and stabbing at times and shortness of breath and nausea yesterday.

## 2020-05-14 NOTE — ED Provider Notes (Signed)
[facility] EMERGENCY DEPARTMENT Provider Note   CSN: [id number] Arrival date & time: 05/14/20  9233     History Chief Complaint  Patient presents with  . Chest Pain    Sierra Pearson is a 49 y.o. female with history of HTN, HLD, chronic pain who presents with chest pain.  Patient states that the chest pains been going on for approximately 2 days.  It feels sharp and burning at times.  Is in the substernal area and nonradiating.  The pain comes and goes and does not matter what position she is in.  It is nonexertional.  She reports associated shortness of breath when she gets up and walks around and a dry cough.  She also had some palpitations this morning.  She denies fever, chills, body aches, sweats, lightheadedness, syncope, abdominal pain, vomiting.  She has chronic right ankle swelling due to prior surgeries of her knee and ankle.  She reports seeing cardiology remotely but she is not sure what she saw them for. She also has chronic issues with muscle spasms in her entire body.  HPI     Past Medical History:  Diagnosis Date  . High cholesterol   . Hypertension   . Hypokalemia     Patient Active Problem List   Diagnosis Date Noted  . Claustrophobia 02/19/2020  . Rupture of anterior cruciate ligament of right knee 10/24/2018  . Sacroiliac joint pain 10/04/2018  . Closed fracture of right ankle with routine healing 07/18/2018  . Bimalleolar ankle fracture, right, closed, initial encounter 07/12/2018  . Lumbar degenerative disc disease 01/06/2018  . Lumbar radiculopathy 01/06/2018  . Lumbar spondylosis 01/06/2018  . Carpal tunnel syndrome, bilateral 11/16/2017  . 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 S/P cervical spinal fusion 03/19/2016  . Bilateral hand numbness 03/17/2016  . Degenerative disc disease, cervical 03/17/2016  . Acute neck pain 11/12/2015  . Acute URI 03/20/2015  . Hyperglycemia 06/14/2014  . Insomnia 06/11/2014  . Obesity 06/11/2014    . 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 Atopic dermatitis 05/29/2014  . Benign essential hypertension 05/29/2014  . Chronic headache 05/29/2014  . Cramp of limb 05/29/2014  . Esophageal reflux 05/29/2014  . Feeling weak 05/29/2014  . Gastritis, erosive 05/29/2014  . 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 Hyperlipemia 05/29/2014  . Irregular menstrual cycle 05/29/2014  . Joint pain, knee 05/29/2014  . Left shoulder pain 05/29/2014  . Low back pain 05/29/2014  . Numbness 05/29/2014  . 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 Other joint derangement, not elsewhere classified, shoulder region 05/29/2014  . Seborrheic dermatitis of scalp 05/29/2014  . Sinus bradycardia 05/29/2014  . Tingling 05/29/2014  . Tobacco use disorder 05/29/2014  . Venereal wart 05/29/2014  . Vitiligo 05/29/2014  . Peripheral neuropathic pain 09/08/2013  . Intervertebral cervical disc disorder with myelopathy, cervical region 05/06/2013  . Spinal stenosis in cervical region 05/06/2013    Past Surgical History:  Procedure Laterality Date  . KNEE SURGERY    . NECK SURGERY    . SPINE SURGERY       OB History   No obstetric history on file.     Family History  Problem Relation Age of Onset  . Cancer Mother     Social History   Tobacco Use  . Smoking status: Former Smoker    Packs/day: 0.50    Years: 20.00    Pack years: 10.00  . Smokeless tobacco: Never Used  Substance Use Topics  . Alcohol use: Yes  Comment: weekly  . Drug use: No    Home Medications Prior to Admission medications   Medication Sig Start Date End Date Taking? Authorizing Provider  ATORVASTATIN CALCIUM PO Take by mouth.    [provider]  DICLOFENAC PO Take by mouth.    [provider]  GABAPENTIN PO Take by mouth.    [provider]  HYDROcodone-acetaminophen (NORCO) 5-325 MG tablet Take 1 tablet by mouth every 6 (six) hours as needed for severe pain. 07/09/18   Molpus, John, MD   ketoconazole (NIZORAL) 2 % cream Apply topically daily.    [provider]  pantoprazole (PROTONIX) 20 MG tablet Take 1 tablet (20 mg total) by mouth daily. 11/03/17   Julianne Rice, MD  potassium chloride (K-DUR,KLOR-CON) 10 MEQ tablet Take 10 mEq by mouth 2 (two) times daily.    [provider]  UNKNOWN TO PATIENT BP MED, MUSCLE RELAXANT    [provider]    Allergies    Adhesive  [tape], Latex, Other, and Penicillins  Review of Systems   Review of Systems  Constitutional: Negative for chills, diaphoresis and fever.  Respiratory: Positive for cough and shortness of breath. Negative for wheezing.   Cardiovascular: Positive for chest pain, palpitations and leg swelling (chronic).  Gastrointestinal: Negative for abdominal pain, nausea and vomiting.  Neurological: Negative for syncope and light-headedness.  All other systems reviewed and are negative.   Physical Exam Updated Vital Signs BP 128/87 (BP Location: Left Arm)   Pulse 78   Temp 98.2 F (36.8 C) (Oral)   Resp 16   Ht 5\' 5"  (1.651 m)   Wt 103.5 kg   SpO2 96%   BMI 37.97 kg/m   Physical Exam Vitals and nursing note reviewed.  Constitutional:      General: She is not in acute distress.    Appearance: Normal appearance. She is well-developed. She is obese. She is not ill-appearing.  HENT:     Head: Normocephalic and atraumatic.  Eyes:     General: No scleral icterus.       Right eye: No discharge.        Left eye: No discharge.     Conjunctiva/sclera: Conjunctivae normal.     Pupils: Pupils are equal, round, and reactive to light.  Cardiovascular:     Rate and Rhythm: Normal rate and regular rhythm.  Pulmonary:     Effort: Pulmonary effort is normal. No respiratory distress.     Breath sounds: Normal breath sounds.  Abdominal:     General: There is no distension.     Palpations: Abdomen is soft.     Tenderness: There is no abdominal tenderness.  Musculoskeletal:     Cervical  back: Normal range of motion.     Right lower leg: Edema (mild ankle edema) present.     Left lower leg: No edema.  Skin:    General: Skin is warm and dry.  Neurological:     Mental Status: She is alert and oriented to person, place, and time.  Psychiatric:        Behavior: Behavior normal.     ED Results / Procedures / Treatments   Labs (all labs ordered are listed, but only abnormal results are displayed) Labs Reviewed  BASIC METABOLIC PANEL - Abnormal; Notable for the following components:      Result Value   Glucose, Bld 122 (*)    BUN 23 (*)    All other components within normal limits  CBC -  Abnormal; Notable for the following components:   WBC 3.7 (*)    RBC 5.20 (*)    MCV 75.2 (*)    MCH 23.3 (*)    RDW 15.7 (*)    All other components within normal limits  TROPONIN I (HIGH SENSITIVITY)  TROPONIN I (HIGH SENSITIVITY)    EKG EKG Interpretation  Date/Time:  Tuesday May 14 2020 09:46:47 EDT Ventricular Rate:  79 PR Interval:    QRS Duration: 96 QT Interval:  400 QTC Calculation: 459 R Axis:   24 Text Interpretation: Sinus rhythm No significant change since last tracing Confirmed by Lajean Saver [phone]) on 05/14/2020 9:58:42 AM   Radiology DG Chest 2 View  Result Date: 05/14/2020 CLINICAL DATA:  Chest pain EXAM: CHEST - 2 VIEW COMPARISON:  November 29, 2017 FINDINGS: The lungs are clear. The heart size and pulmonary vascularity are normal. No adenopathy. There is postoperative change in the lower cervical region. No pneumothorax. IMPRESSION: Lungs clear.  Cardiac silhouette normal. Electronically Signed   By: Lowella Grip III M.D.   On: 05/14/2020 10:21    Procedures Procedures (including critical care time)  Medications Ordered in ED Medications  methocarbamol (ROBAXIN) tablet 500 mg (500 mg Oral Given 05/14/20 1214)  alum & mag hydroxide-simeth (MAALOX/MYLANTA) 200-200-20 MG/5ML suspension 30 mL (30 mLs Oral Given 05/14/20 1215)    ED Course  I  have reviewed the triage vital signs and the nursing notes.  Pertinent labs & imaging results that were available during my care of the patient were reviewed by me and considered in my medical decision making (see chart for details).  49 year old with substernal chest pain that has been coming and going for 2 days. Chest pain work up is reassuring. Doubt ACS, PE, pericarditis, esophageal rupture, tension pneumothorax, aortic dissection, cardiac tamponade. EKG is NSR. CXR is negative. Initial and second troponin is 4. Labs are remarkable for mild leukopenia. HEART score is 2. She was reassessed multiple times and is comfortable appearing, working on her laptop. She was given muscle relaxer and GI cocktail and hasn't had recurrence of pain. Could be GI or MSK etiology. Advised Pepcid as needed and to f/u with PCP.  MDM Rules/Calculators/A&P                           Final Clinical Impression(s) / ED Diagnoses Final diagnoses:  Atypical chest pain    Rx / DC Orders ED Discharge Orders    None       Recardo Evangelist, PA-C 05/14/20 1448    Lajean Saver, MD 05/14/20 [phone]

## 2020-05-14 NOTE — Discharge Instructions (Addendum)
Take Pepcid as needed for chest/upper abdominal pain Avoid very spicy, acidic, or fatty foods as well as alcohol or NSAIDs (Ibuprofen, Advil, Naproxen, Aleve) since this can make stomach problems worse Please follow up with your doctor Return to the ER if you are worsening

## 2021-01-31 ENCOUNTER — Encounter: Payer: Self-pay | Admitting: Gastroenterology

## 2021-02-26 ENCOUNTER — Ambulatory Visit (AMBULATORY_SURGERY_CENTER): Payer: 59

## 2021-02-26 ENCOUNTER — Other Ambulatory Visit: Payer: Self-pay

## 2021-02-26 VITALS — Ht 64.5 in | Wt 229.0 lb

## 2021-02-26 DIAGNOSIS — Z1211 Encounter for screening for malignant neoplasm of colon: Secondary | ICD-10-CM

## 2021-02-26 MED ORDER — SUTAB 1479-225-188 MG PO TABS: 12.0000 | ORAL_TABLET | ORAL | 0 refills | Status: DC

## 2021-02-26 NOTE — Progress Notes (Signed)
Pt verified name, DOB, address and insurance during PV today.   Pt mailed instruction packet to included paper to complete  copy of consent form to read and not return, and instructions. Sutab coupon mailed in packet. PV completed over the phone. Pt encouraged to call with questions or issues.   No allergies to soy or egg Pt is not on blood thinners or diet pills Denies issues with sedation/intubation Denies atrial flutter/fib Denies constipation    Pt is aware of Covid safety and care partner requirements.   Pt states has full range of motion in neck post spinal surgeries.  Screening process done and started going over prep-pt noted that she did not want a female doctor for procedure.  Per her requested changed to Dr. Tarri Glenn for Thu-4/21 @ 11:30.  Patient was at work and did not wish to go over prep letter since updates needed to be done and she no longer had the time to give.  Stressed the importance of reading over and calling for any questions she may have.  Stressed the importance of following the directions specifically in order to have a clean colon for viewing.

## 2021-02-28 ENCOUNTER — Telehealth: Payer: Self-pay | Admitting: Gastroenterology

## 2021-02-28 NOTE — Telephone Encounter (Signed)
Pt has not received her packet with the SUtab coupon- Informed her once she gets the Sutab coupon, take that to her pharmacy and have them run the coupon and see the price- if after this it's still too expensive call us back- pt verbalized understanding

## 2021-03-19 ENCOUNTER — Encounter: Payer: 59 | Admitting: Gastroenterology

## 2021-03-20 ENCOUNTER — Other Ambulatory Visit: Payer: Self-pay

## 2021-03-20 ENCOUNTER — Other Ambulatory Visit: Payer: Self-pay | Admitting: Gastroenterology

## 2021-03-20 ENCOUNTER — Ambulatory Visit (AMBULATORY_SURGERY_CENTER): Payer: 59 | Admitting: Gastroenterology

## 2021-03-20 ENCOUNTER — Encounter: Payer: Self-pay | Admitting: Gastroenterology

## 2021-03-20 VITALS — BP 150/93 | HR 62 | Temp 97.8°F | Resp 16 | Ht 64.0 in | Wt 229.0 lb

## 2021-03-20 DIAGNOSIS — D128 Benign neoplasm of rectum: Secondary | ICD-10-CM

## 2021-03-20 DIAGNOSIS — Z1211 Encounter for screening for malignant neoplasm of colon: Secondary | ICD-10-CM

## 2021-03-20 DIAGNOSIS — K635 Polyp of colon: Secondary | ICD-10-CM

## 2021-03-20 DIAGNOSIS — D124 Benign neoplasm of descending colon: Secondary | ICD-10-CM

## 2021-03-20 DIAGNOSIS — K621 Rectal polyp: Secondary | ICD-10-CM | POA: Diagnosis not present

## 2021-03-20 MED ORDER — SODIUM CHLORIDE 0.9 % IV SOLN
500.0000 mL | Freq: Once | INTRAVENOUS | Status: DC
Start: 2021-03-20 — End: 2021-03-20

## 2021-03-20 NOTE — Progress Notes (Signed)
Called to room to assist during endoscopic procedure.  Patient ID and intended procedure confirmed with present staff. Received instructions for my participation in the procedure from the performing physician.  

## 2021-03-20 NOTE — Patient Instructions (Signed)
Please read over handouts about polyps  Await pathology  Please continue your normal medications  YOU HAD AN ENDOSCOPIC PROCEDURE TODAY AT [facility]:   Refer to the procedure report that was given to you for any specific questions about what was found during the examination.  If the procedure report does not answer your questions, please call your gastroenterologist to clarify.  If you requested that your care partner not be given the details of your procedure findings, then the procedure report has been included in a sealed envelope for you to review at your convenience later.  YOU SHOULD EXPECT: Some feelings of bloating in the abdomen. Passage of more gas than usual.  Walking can help get rid of the air that was put into your GI tract during the procedure and reduce the bloating. If you had a lower endoscopy (such as a colonoscopy or flexible sigmoidoscopy) you may notice spotting of blood in your stool or on the toilet paper. If you underwent a bowel prep for your procedure, you may not have a normal bowel movement for a few days.  Please Note:  You might notice some irritation and congestion in your nose or some drainage.  This is from the oxygen used during your procedure.  There is no need for concern and it should clear up in a day or so.  SYMPTOMS TO REPORT IMMEDIATELY:   Following lower endoscopy (colonoscopy or flexible sigmoidoscopy):  Excessive amounts of blood in the stool  Significant tenderness or worsening of abdominal pains  Swelling of the abdomen that is new, acute  Fever of 100F or higher  For urgent or emergent issues, a gastroenterologist can be reached at any hour by calling [phone]. Do not use MyChart messaging for urgent concerns.    DIET:  We do recommend a small meal at first, but then you may proceed to your regular diet.  Drink plenty of fluids but you should avoid alcoholic beverages for 24 hours.  ACTIVITY:  You should plan to take  it easy for the rest of today and you should NOT DRIVE or use heavy machinery until tomorrow (because of the sedation medicines used during the test).    FOLLOW UP: Our staff will call the number listed on your records 48-72 hours following your procedure to check on you and address any questions or concerns that you may have regarding the information given to you following your procedure. If we do not reach you, we will leave a message.  We will attempt to reach you two times.  During this call, we will ask if you have developed any symptoms of COVID 19. If you develop any symptoms (ie: fever, flu-like symptoms, shortness of breath, cough etc.) before then, please call [phone].  If you test positive for Covid 19 in the 2 weeks post procedure, please call and report this information to [place].    If any biopsies were taken you will be contacted by phone or by letter within the next 1-3 weeks.  Please call us at [phone] if you have not heard about the biopsies in 3 weeks.    SIGNATURES/CONFIDENTIALITY: You and/or your care partner have signed paperwork which will be entered into your electronic medical record.  These signatures attest to the fact that that the information above on your After Visit Summary has been reviewed and is understood.  Full responsibility of the confidentiality of this discharge information lies with you and/or your care-partner.

## 2021-03-20 NOTE — Op Note (Signed)
[facility] Patient Name: Sierra Pearson Procedure Date: 03/20/2021 11:49 AM MRN: [id number] Endoscopist: Thornton Park MD, MD Age: 50 Referring MD:  Date of Birth: 03-22-1971 Gender: Female Account #: [id number] Procedure:                Colonoscopy Indications:              Screening for colorectal malignant neoplasm, This                            is the patient's first colonoscopy                           No known family history of colon cancer or polyps Medicines:                Monitored Anesthesia Care Procedure:                Pre-Anesthesia Assessment:                           - Prior to the procedure, a History and Physical                            was performed, and patient medications and                            allergies were reviewed. The patient's tolerance of                            previous anesthesia was also reviewed. The risks                            and benefits of the procedure and the sedation                            options and risks were discussed with the patient.                            All questions were answered, and informed consent                            was obtained. Prior Anticoagulants: The patient has                            taken no previous anticoagulant or antiplatelet                            agents. ASA Grade Assessment: II - A patient with                            mild systemic disease. After reviewing the risks                            and benefits, the patient was deemed in  satisfactory condition to undergo the procedure.                           After obtaining informed consent, the colonoscope                            was passed under direct vision. Throughout the                            procedure, the patient's blood pressure, pulse, and                            oxygen saturations were monitored continuously. The                            Olympus CF-HQ190L  [id number]) Colonoscope was                            introduced through the anus and advanced to the 2                            cm into the ileum. A second forward view of the                            right colon was performed. The colonoscopy was                            technically difficult and complex due to a                            redundant colon, significant looping and a tortuous                            colon. Successful completion of the procedure was                            aided by changing the patient's position,                            withdrawing and reinserting the scope and applying                            abdominal pressure. The patient tolerated the                            procedure well. The quality of the bowel                            preparation was good. The terminal ileum, ileocecal                            valve, appendiceal orifice, and rectum were  photographed. Scope In: 11:55:05 AM Scope Out: 12:14:17 PM Scope Withdrawal Time: 0 hours 10 minutes 20 seconds  Total Procedure Duration: 0 hours 19 minutes 12 seconds  Findings:                 The perianal and digital rectal examinations were                            normal.                           A 2 mm polyp was found in the descending colon. The                            polyp was sessile. The polyp was removed with a                            cold snare. Resection and retrieval were complete.                            Estimated blood loss was minimal.                           A 3 mm polyp was found in the rectum. The polyp was                            sessile. The polyp was removed with a cold snare.                            Resection and retrieval were complete. Estimated                            blood loss was minimal.                           No additional abnormalities were found on                             retroflexion. Complications:            No immediate complications. Estimated blood loss:                            Minimal. Estimated Blood Loss:     Estimated blood loss was minimal. Impression:               - One 2 mm polyp in the descending colon, removed                            with a cold snare. Resected and retrieved.                           - One 3 mm polyp in the rectum, removed with a cold  snare. Resected and retrieved. Recommendation:           - Patient has a contact number available for                            emergencies. The signs and symptoms of potential                            delayed complications were discussed with the                            patient. Return to normal activities tomorrow.                            Written discharge instructions were provided to the                            patient.                           - Resume previous diet.                           - Continue present medications.                           - Await pathology results.                           - Repeat colonoscopy date to be determined after                            pending pathology results are reviewed for                            surveillance.                           - Emerging evidence supports eating a diet of                            fruits, vegetables, grains, calcium, and yogurt                            while reducing red meat and alcohol may reduce the                            risk of colon cancer.                           - Thank you for allowing me to be involved in your                            colon cancer prevention. Thornton Park MD, MD 03/20/2021 12:20:15 PM This report has been signed electronically.

## 2021-03-20 NOTE — Progress Notes (Signed)
To PACU, VSS. Report to [email or web address] 

## 2021-03-24 ENCOUNTER — Telehealth: Payer: Self-pay | Admitting: *Deleted

## 2021-03-24 ENCOUNTER — Telehealth: Payer: Self-pay

## 2021-03-24 NOTE — Telephone Encounter (Signed)
Left message on f/u call 

## 2021-03-24 NOTE — Telephone Encounter (Signed)
LVM

## 2021-03-28 ENCOUNTER — Encounter: Payer: Self-pay | Admitting: Gastroenterology

## 2021-03-31 ENCOUNTER — Encounter: Payer: Self-pay | Admitting: Neurology

## 2021-03-31 ENCOUNTER — Ambulatory Visit (INDEPENDENT_AMBULATORY_CARE_PROVIDER_SITE_OTHER): Payer: 59 | Admitting: Neurology

## 2021-03-31 VITALS — BP 141/93 | HR 66 | Ht 64.0 in | Wt 232.0 lb

## 2021-03-31 DIAGNOSIS — M79604 Pain in right leg: Secondary | ICD-10-CM

## 2021-03-31 DIAGNOSIS — M79605 Pain in left leg: Secondary | ICD-10-CM | POA: Diagnosis not present

## 2021-03-31 DIAGNOSIS — R202 Paresthesia of skin: Secondary | ICD-10-CM

## 2021-03-31 NOTE — Progress Notes (Signed)
Chief Complaint  Patient presents with  . New Patient (Initial Visit)    She has low back pain that radiates down both legs. Reports numbness in toes. Prolonged walking makes her discomfort worse. Sitting helps some. She also has numbness in her fingers when she turns her head to the left or right. She often has to readjust her pillow to get rest at night. She is taking diclofenac 75mg  BID and gabapentin 100mg  (two caps in am, one cap in pm).      ASSESSMENT AND PLAN  Sierra Pearson is a 50 y.o. female  Long history of chronic low back pain, slowly worsening Bilateral upper and lower extremity paresthesia, pain  Brisk reflexes, need to rule out peripheral neuropathy, with superimposed cervical spondylitic myelopathy  EMG nerve conduction study  MRI of cervical spine  DIAGNOSTIC DATA (LABS, IMAGING, TESTING) - I reviewed patient records, labs, notes, testing and imaging myself where available.  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  No acute fracture nor malalignment.  Mild canal stenosis L3-4. Neural foraminal narrowing L2-3 through L5-S1: Moderate on the RIGHT at L5-S1.  Laboratory evaluation in 2022: LDL 103, A1c 6.0, normal BMP with creatinine of 0.9, CBC showed slightly decreased hemoglobin of 11.6, normal TSH, negative hepatitis C antibody,  HISTORICAL  Sierra Pearson is a 50 year old female, seen in request by primary care nurse practitioner Berkley Harvey, for evaluation of low back pain, initial evaluation was on Mar 31, 2021  I reviewed and summarized the referring note. PMHX. HTN HLD,   She reported a history of chronic low back pain for more than 10 years, gradually getting worse, worsening low back pain after standing for more than 10 minutes, prolonged walking, oftentimes also had radiating pain from back to  lower extremity, she has intermittent bilateral toe numbness,  She denies bowel and bladder incontinence  REVIEW OF SYSTEMS:  Full 14 system review of systems performed and notable only for as above All other review of systems were negative.  PHYSICAL EXAM:   Vitals:   03/31/21 1340  BP: (!) 141/93  Pulse: 66  Weight: 232 lb (105.2 kg)  Height: 5\' 4"  (1.626 m)    Body mass index is 39.82 kg/m.  PHYSICAL EXAMNIATION:  Gen: NAD, conversant, well nourised, well groomed                     Cardiovascular: Regular rate rhythm, no peripheral edema, warm, nontender. Eyes: Conjunctivae clear without exudates or hemorrhage Neck: Supple, no carotid bruits. Pulmonary: Clear to auscultation bilaterally   NEUROLOGICAL EXAM:  MENTAL STATUS: Speech:    Speech is normal; fluent and spontaneous with normal comprehension.  Cognition:     Orientation to time, place and person     Normal recent and remote memory     Normal Attention span and concentration     Normal Language, naming, repeating,spontaneous speech     Fund of knowledge   CRANIAL NERVES: CN II: Visual fields are full to confrontation. Pupils are round equal and briskly reactive to light. CN III, IV, VI: extraocular movement are normal. No ptosis. CN V: Facial sensation is intact to light touch CN VII: Face is symmetric with normal eye closure  CN VIII: Hearing is normal to causal conversation. CN IX, X: Phonation is normal. CN XI: Head turning and shoulder shrug are  intact  MOTOR: There is no pronator drift of out-stretched arms. Muscle bulk and tone are normal. Muscle strength is normal.  REFLEXES: Reflexes are 2+ and symmetric at the biceps, triceps, knees, and trace at ankles. Plantar responses are flexor.  SENSORY: Intact to light touch, pinprick and vibratory sensation are intact in fingers and toes.  COORDINATION: There is no trunk or limb dysmetria noted.  GAIT/STANCE: She needs push-up to get up from  seated position, limited by her body habitus, cautious , ALLERGIES: Allergies  Allergen Reactions  . Adhesive  [Tape] Dermatitis    plastic  . Bee Pollen Swelling  . Wound Dressing Adhesive Dermatitis    plastic  . Latex Rash  . Other Itching    Currently able to eat shellfish   . Penicillins Rash    HOME MEDICATIONS: Current Outpatient Medications  Medication Sig Dispense Refill  . ATORVASTATIN CALCIUM PO Take by mouth.    . diclofenac (VOLTAREN) 75 MG EC tablet Take 75 mg by mouth 2 (two) times daily.    Marland Kitchen gabapentin (NEURONTIN) 100 MG capsule Take 100 mg by mouth 3 (three) times daily. She is taking two capsules in the morning and one capsule at bedtime.    . hydrochlorothiazide (MICROZIDE) 12.5 MG capsule Take 1 capsule by mouth daily.    . potassium chloride (K-DUR,KLOR-CON) 10 MEQ tablet Take 10 mEq by mouth 2 (two) times daily.     No current facility-administered medications for this visit.    PAST MEDICAL HISTORY: Past Medical History:  Diagnosis Date  . High cholesterol   . Hypertension   . Low back pain     PAST SURGICAL HISTORY: Past Surgical History:  Procedure Laterality Date  . ANKLE SURGERY Right   . CHOLECYSTECTOMY    . KNEE SURGERY Bilateral   . NECK SURGERY      FAMILY HISTORY: Family History  Problem Relation Age of Onset  . Breast cancer Mother   . Aneurysm Father   . Colon cancer Neg Hx   . Colon polyps Neg Hx   . Esophageal cancer Neg Hx   . Rectal cancer Neg Hx   . Stomach cancer Neg Hx     SOCIAL HISTORY: Social History   Socioeconomic History  . Marital status: Single    Spouse name: Not on file  . Number of children: 1  . Years of education: three years of college  . Highest education level: Not on file  Occupational History  . Occupation: Information systems manager  Tobacco Use  . Smoking status: Former Smoker    Packs/day: 0.50    Years: 20.00    Pack years: 10.00  . Smokeless tobacco: Never Used  Vaping Use  . Vaping Use:  Never used  Substance and Sexual Activity  . Alcohol use: Yes    Comment: social  . Drug use: No  . Sexual activity: Not on file  Other Topics Concern  . Not on file  Social History Narrative   Lives alone.   Left-handed.   Rare caffeine.   Social Determinants of Health   Financial Resource Strain: Not on file  Food Insecurity: Not on file  Transportation Needs: Not on file  Physical Activity: Not on file  Stress: Not on file  Social Connections: Not on file  Intimate Partner Violence: Not on file      Marcial Pacas, M.D. Ph.D.  [facility] [address] [place], [place] [address] Ph: [phone] Fax: [phone]  CC:  Berkley Harvey, NP [place],  [place] [id number]  Berkley Harvey, NP

## 2021-04-02 ENCOUNTER — Telehealth: Payer: Self-pay | Admitting: Neurology

## 2021-04-02 NOTE — Telephone Encounter (Signed)
Patient requested open MRI. [employer] auth: NPR via [employer] website order faxed to [facility] they w ll reach out to the pt to schedule. Ph # [phone] & fax # [phone].

## 2021-04-16 ENCOUNTER — Encounter (INDEPENDENT_AMBULATORY_CARE_PROVIDER_SITE_OTHER): Payer: 59 | Admitting: Neurology

## 2021-04-16 ENCOUNTER — Other Ambulatory Visit: Payer: Self-pay

## 2021-04-16 ENCOUNTER — Ambulatory Visit (INDEPENDENT_AMBULATORY_CARE_PROVIDER_SITE_OTHER): Payer: 59 | Admitting: Neurology

## 2021-04-16 DIAGNOSIS — R202 Paresthesia of skin: Secondary | ICD-10-CM | POA: Diagnosis not present

## 2021-04-16 DIAGNOSIS — Z0289 Encounter for other administrative examinations: Secondary | ICD-10-CM

## 2021-04-16 DIAGNOSIS — M79604 Pain in right leg: Secondary | ICD-10-CM

## 2021-04-16 NOTE — Procedures (Signed)
Full Name: Sierra Pearson Gender: Female MRN #: [id number] Date of Birth: 04-17-1971    Visit Date: 04/16/2021 08:12 Age: 50 Years Examining Physician: Marcial Pacas, MD  Referring Physician: Marcial Pacas, MD History: 50 year old female complains of intermittent low back pain, upper and lower extremity paresthesia  Summary of the test  Nerve conduction study:  Bilateral sural, superficial peroneal sensory responses were normal.  Reden, median mixed responses were within normal limit.  Bilateral tibial, peroneal to EDB motor responses were normal  Electromyography:  Selected needle examination of right upper, lower extremity muscles, cervical and lumbosacral paraspinal muscles were normal.   Conclusion: This is a normal study.  There is NO electrodiagnostic evidence of large fiber peripheral neuropathy, upper extremity neuropathy, right cervical or lumbosacral radiculopathy.    ------------------------------- Marcial Pacas, M.D. PhD  [facility] [address], [place], [place] [address] Tel: [phone] Fax: [phone]  Verbal informed consent was obtained from the patient, patient was informed of potential risk of procedure, including bruising, bleeding, hematoma formation, infection, muscle weakness, muscle pain, numbness, among others.        [place]    Nerve / Sites Muscle Latency Ref. Amplitude Ref. Rel Amp Segments Distance Velocity Ref. Area    ms ms mV mV %  cm m/s m/s mVms  R Peroneal - EDB     Ankle EDB 3.6 ?6.5 8.2 ?2.0 100 Ankle - EDB 9   24.6     Fib head EDB 9.1  7.3  88.7 Fib head - Ankle 27 49 ?44 22.5     Pop fossa EDB 11.3  7.2  99.2 Pop fossa - Fib head 10 46 ?44 22.5         Pop fossa - Ankle      L Peroneal - EDB     Ankle EDB 4.3 ?6.5 6.3 ?2.0 100 Ankle - EDB 9   20.7     Fib head EDB 9.6  5.8  92.7 Fib head - Ankle 26 50 ?44 19.1     Pop fossa EDB 11.8  5.8  98.8 Pop fossa - Fib head 10 44 ?44 19.3         Pop fossa -  Ankle      R Tibial - AH     Ankle AH 3.4 ?5.8 5.7 ?4.0 100 Ankle - AH 9   8.8     Pop fossa AH 11.7  4.9  86.3 Pop fossa - Ankle 35 42 ?41 10.4  L Tibial - AH     Ankle AH 4.0 ?5.8 5.9 ?4.0 100 Ankle - AH 9   9.2     Pop fossa AH 12.1  5.0  85 Pop fossa - Ankle 33 41 ?41 10.4              SNC    Nerve / Sites Rec. Site Peak Lat Ref.  Amp Ref. Segments Distance Peak Diff Ref.    ms ms V V  cm ms ms  R Sural - Ankle (Calf)     Calf Ankle 3.8 ?4.4 9 ?6 Calf - Ankle 14    L Sural - Ankle (Calf)     Calf Ankle 4.0 ?4.4 9 ?6 Calf - Ankle 14    R Superficial peroneal - Ankle     Lat leg Ankle 3.8 ?4.4 6 ?6 Lat leg - Ankle 14    L Superficial peroneal - Ankle     Lat  leg Ankle 3.6 ?4.4 6 ?6 Lat leg - Ankle 14    L Median, Ulnar - Transcarpal comparison     Median Palm Wrist 2.1 ?2.2 37 ?35 Median Palm - Wrist 8       Ulnar Palm Wrist 1.8 ?2.2 16 ?12 Ulnar Palm - Wrist 8          Median Palm - Ulnar Palm  0.3 ?0.4  R Median, Ulnar - Transcarpal comparison     Median Palm Wrist 2.1 ?2.2 49 ?35 Median Palm - Wrist 8       Ulnar Palm Wrist 1.9 ?2.2 13 ?12 Ulnar Palm - Wrist 8          Median Palm - Ulnar Palm  0.3 ?0.4  L Median - Orthodromic (Dig II, Mid palm)     Dig II Wrist 2.9 ?3.4 11 ?10 Dig II - Wrist 13    R Median - Orthodromic (Dig II, Mid palm)     Dig II Wrist 3.0 ?3.4 12 ?10 Dig II - Wrist 13    L Ulnar - Orthodromic, (Dig V, Mid palm)     Dig V Wrist 2.5 ?3.1 7 ?5 Dig V - Wrist 11    R Ulnar - Orthodromic, (Dig V, Mid palm)     Dig V Wrist 2.5 ?3.1 6 ?5 Dig V - Wrist 30                           F  Wave    Nerve F Lat Ref.   ms ms  R Tibial - AH 50.4 ?56.0  L Tibial - AH 49.6 ?56.0         EMG Summary Table    Spontaneous MUAP Recruitment  Muscle IA Fib PSW Fasc Other Amp Dur. Poly Pattern  R. First dorsal interosseous Normal None None None _______ Normal Normal Normal Normal  R. Pronator teres Normal None None None _______ Normal Normal Normal Normal  R. Biceps  brachii Normal None None None _______ Normal Normal Normal Normal  R. Deltoid Normal None None None _______ Normal Normal Normal Normal  R. Triceps brachii Normal None None None _______ Normal Normal Normal Normal  R. Extensor digitorum communis Normal None None None _______ Normal Normal Normal Normal  R. Cervical paraspinals Normal None None None _______ Normal Normal Normal Normal  R. Cricothyroid Normal None None None _______ Normal Normal Normal Normal  R. Tibialis anterior Normal None None None _______ Normal Normal Normal Normal  R. Tibialis posterior Normal None None None _______ Normal Normal Normal Normal  R. Peroneus longus Normal None None None _______ Normal Normal Normal Normal  R. Gastrocnemius (Medial head) Normal None None None _______ Normal Normal Normal Normal  R. Vastus lateralis Normal None None None _______ Normal Normal Normal Normal  R. Lumbar paraspinals (low) Normal None None None _______ Normal Normal Normal Normal  R. Lumbar paraspinals (mid) Normal None None None _______ Normal Normal Normal Normal

## 2021-07-10 ENCOUNTER — Telehealth: Payer: Self-pay | Admitting: Neurology

## 2021-07-10 NOTE — Telephone Encounter (Signed)
Pt called, MRI scheduled tomorrow, need medication for claustrophobia. Would like a call from the nurse.

## 2021-07-11 ENCOUNTER — Telehealth: Payer: Self-pay | Admitting: Neurology

## 2021-07-11 MED ORDER — ALPRAZOLAM 0.5 MG PO TABS: ORAL_TABLET | ORAL | 0 refills | Status: DC

## 2021-07-11 NOTE — Telephone Encounter (Signed)
Pt has asked the On Call Dr be reached out to for the medication to be called in to help her relax for her 5pm MRI today.  Pt is asking the medication be called into the [facility] @ [address] in [place] [phone]

## 2021-07-11 NOTE — Telephone Encounter (Signed)
Meds ordered this encounter  Medications   ALPRAZolam (XANAX) 0.5 MG tablet    Sig: for sedation before MRI scan; take 1 tab 1 hour before scan; may repeat 1 tab 15 min before scan; needs driver for scan    Dispense:  3 tablet    Refill:  0    Penni Bombard, MD [id number], 0000000 PM Certified in Neurology, Neurophysiology and [facility] [address], [place], [place] [address] [phone]

## 2021-07-14 MED ORDER — ALPRAZOLAM 1 MG PO TABS: ORAL_TABLET | ORAL | 0 refills | Status: DC

## 2021-07-14 NOTE — Telephone Encounter (Signed)
Meds ordered this encounter  Medications   ALPRAZolam (XANAX) 1 MG tablet    Sig: Take 1-2 tablets 30 minutes prior to MRI, may repeat once as needed. Must have driver.    Dispense:  3 tablet    Refill:  0     

## 2021-07-14 NOTE — Telephone Encounter (Signed)
See other phone note. Rx sent in by Dr. Leta Baptist. Rx sent by Dr. Krista Blue voided at pharmacy.

## 2021-07-14 NOTE — Telephone Encounter (Signed)
I called [facility] and spoke to [place]. This prescription will be voided.

## 2021-07-14 NOTE — Telephone Encounter (Signed)
Would you please call to cancel my new xanx order '1mg'$  3 tab from 8/15, I did not realize that the Rx was sent in by Dr. Tamera Reason 0.'5mg'$  3 tabs on August 12 already

## 2021-07-14 NOTE — Addendum Note (Signed)
Addended by: Noberto Retort C on: 07/14/2021 02:42 PM   Modules accepted: Orders

## 2021-07-16 ENCOUNTER — Telehealth: Payer: Self-pay | Admitting: Neurology

## 2021-07-16 DIAGNOSIS — R937 Abnormal findings on diagnostic imaging of other parts of musculoskeletal system: Secondary | ICD-10-CM | POA: Insufficient documentation

## 2021-07-16 DIAGNOSIS — R202 Paresthesia of skin: Secondary | ICD-10-CM

## 2021-07-16 NOTE — Telephone Encounter (Signed)
The patient verbalized understanding of the cervical MRI results. Agreeable to proceed with brain MRI. I send her a code to set up her mychart account. She scheduled a follow up for 11/06/21 w/ Sarah.

## 2021-07-16 NOTE — Telephone Encounter (Signed)
Patient requested open mri.   [employer] auth: NPR via [employer] website order faxed to [facility] they will reach out to the patient to schedule. They will reach out to the patient to scheduled.

## 2021-07-16 NOTE — Telephone Encounter (Signed)
scheduled for 8/24 6pm at [facility].

## 2021-07-16 NOTE — Telephone Encounter (Signed)
Please call patient, reviewed MRI of cervical spine from [facility] on July 11, 2021, small foci of myelomalacia at the spinal cord at C5-6, mild focal atrophy of spinal cord at C5-6  Evidence of previous anterior fusion and instrumentation at C5-6, C6-7  The described myelomalacia and likely residual deficit from previous cervical myelopathy With her worsening paresthesia, I would proceed with MRI of the brain to rule out other central nervous system etiology  We will call her MRI of brain report  Please also give her a follow-up appointment with Judson Roch in December or January 2023

## 2021-07-29 ENCOUNTER — Telehealth: Payer: Self-pay | Admitting: Neurology

## 2021-07-29 DIAGNOSIS — R202 Paresthesia of skin: Secondary | ICD-10-CM

## 2021-07-29 NOTE — Telephone Encounter (Signed)
Please call patient, MRI of the brain from [facility] July 23, 2021 which showed no significant abnormalities.   IMPRESSION:  1.  No acute intracranial abnormality.  2.  Convex superior border of the pituitary gland. This is prominent for the patient's age and recommend clinical and laboratory correlation.

## 2021-07-30 MED ORDER — DULOXETINE HCL 30 MG PO CPEP: 30.0000 mg | ORAL_CAPSULE | Freq: Every day | ORAL | 5 refills | Status: DC

## 2021-07-30 NOTE — Addendum Note (Signed)
Addended by: Marcial Pacas on: 07/30/2021 08:52 AM   Modules accepted: Orders

## 2021-07-30 NOTE — Telephone Encounter (Signed)
Noted. Follow up canceled. Future refills of medication will be managed by her PCP.

## 2021-07-30 NOTE — Telephone Encounter (Signed)
I called patient, she continued complaints of frequent bilateral upper and lower extremity paresthesia, achy pain  Gabapentin 100 mg 3 times daily did not provide significant help, caused some sleepiness, she is also on diclofenac 75 mg twice a day,  MRI of the brain showed no significant abnormality, MRI of cervical spine from [facility] July 11, 2021 showed myelomalacia in the spinal cord at C5, and C5-6, focal atrophy of the spinal cord, moderate bilateral foraminal stenosis at C3-4, previous anterior fusion and instrumentation at C5-6, C6-7  Her complaints of upper and lower extremity paresthesia likely related to residual symptoms from previous cervical spondylitic myelopathy, continued evidence of spinal cord atrophy, myelomalacia  I have added on Cymbalta 30 mg daily, may move gabapentin 100 mg 3 tablets at nighttime, diclofenac as needed, she will continue follow-up with her primary care, please cancel previously scheduled followup appointment

## 2021-11-06 ENCOUNTER — Ambulatory Visit: Payer: 59 | Admitting: Neurology

## 2022-05-29 ENCOUNTER — Ambulatory Visit (INDEPENDENT_AMBULATORY_CARE_PROVIDER_SITE_OTHER): Payer: 59 | Admitting: Podiatry

## 2022-05-29 DIAGNOSIS — Z794 Long term (current) use of insulin: Secondary | ICD-10-CM | POA: Diagnosis not present

## 2022-05-29 DIAGNOSIS — Q666 Other congenital valgus deformities of feet: Secondary | ICD-10-CM | POA: Diagnosis not present

## 2022-05-29 DIAGNOSIS — E119 Type 2 diabetes mellitus without complications: Secondary | ICD-10-CM | POA: Diagnosis not present

## 2022-05-29 NOTE — Progress Notes (Signed)
Subjective: Sierra Pearson presents today referred by Berkley Harvey, NP for diabetic foot evaluation.  Patient relates 1 year history of diabetes.  Patient denies any history of foot wounds.  Patient denies any history of numbness, tingling, burning, pins/needles sensations.  Past Medical History:  Diagnosis Date   High cholesterol    Hypertension    Low back pain     Patient Active Problem List   Diagnosis Date Noted   Abnormal MRI, cervical spine 07/16/2021   Paresthesia 03/31/2021   Pain in both lower extremities 03/31/2021   Claustrophobia 02/19/2020   Rupture of anterior cruciate ligament of right knee 10/24/2018   Sacroiliac joint pain 10/04/2018   Closed fracture of right ankle with routine healing 07/18/2018   Bimalleolar ankle fracture, right, closed, initial encounter 07/12/2018   Lumbar degenerative disc disease 01/06/2018   Lumbar radiculopathy 01/06/2018   Lumbar spondylosis 01/06/2018   Carpal tunnel syndrome, bilateral 11/16/2017   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   S/P cervical spinal fusion 03/19/2016   Bilateral hand numbness 03/17/2016   Degenerative disc disease, cervical 03/17/2016   Acute neck pain 11/12/2015   Acute URI 03/20/2015   Hyperglycemia 06/14/2014   Insomnia 06/11/2014   Obesity 06/11/2014   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   Atopic dermatitis 05/29/2014   Benign essential hypertension 05/29/2014   Chronic headache 05/29/2014   Cramp of limb 05/29/2014   Esophageal reflux 05/29/2014   Feeling weak 05/29/2014   Gastritis, erosive 05/29/2014   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   Hyperlipemia 05/29/2014   Irregular menstrual cycle 05/29/2014   Joint pain, knee 05/29/2014   Left shoulder pain 05/29/2014   Low back pain 05/29/2014   Numbness 05/29/2014   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   Other joint derangement, not elsewhere classified, shoulder region 05/29/2014   Seborrheic dermatitis of scalp 05/29/2014    Sinus bradycardia 05/29/2014   Tingling 05/29/2014   Tobacco use disorder 05/29/2014   Venereal wart 05/29/2014   Vitiligo 05/29/2014   Peripheral neuropathic pain 09/08/2013   Intervertebral cervical disc disorder with myelopathy, cervical region 05/06/2013   Spinal stenosis in cervical region 05/06/2013    Past Surgical History:  Procedure Laterality Date   ANKLE SURGERY Right    CHOLECYSTECTOMY     KNEE SURGERY Bilateral    NECK SURGERY      Current Outpatient Medications on File Prior to Visit  Medication Sig Dispense Refill   ALPRAZolam (XANAX) 0.5 MG tablet for sedation before MRI scan; take 1 tab 1 hour before scan; may repeat 1 tab 15 min before scan; needs driver for scan 3 tablet 0   ATORVASTATIN CALCIUM PO Take by mouth.     diclofenac (VOLTAREN) 75 MG EC tablet Take 75 mg by mouth 2 (two) times daily.     DULoxetine (CYMBALTA) 30 MG capsule Take 1 capsule (30 mg total) by mouth daily. 30 capsule 5   gabapentin (NEURONTIN) 100 MG capsule Take 100 mg by mouth 3 (three) times daily. She is taking two capsules in the morning and one capsule at bedtime.     hydrochlorothiazide (MICROZIDE) 12.5 MG capsule Take 1 capsule by mouth daily.     potassium chloride (K-DUR,KLOR-CON) 10 MEQ tablet Take 10 mEq by mouth 2 (two) times daily.     No current facility-administered medications on file prior to visit.     Allergies  Allergen Reactions   Adhesive  [Tape] Dermatitis    plastic   Bee Pollen Swelling   Wound Dressing Adhesive  Dermatitis    plastic   Latex Rash   Other Itching    Currently able to eat shellfish    Penicillins Rash    Social History   Occupational History   Occupation: Information systems manager  Tobacco Use   Smoking status: Former    Packs/day: 0.50    Years: 20.00    Total pack years: 10.00    Types: Cigarettes   Smokeless tobacco: Never  Vaping Use   Vaping Use: Never used  Substance and Sexual Activity   Alcohol use: Yes    Comment: social    Drug use: No   Sexual activity: Not on file    Family History  Problem Relation Age of Onset   Breast cancer Mother    Aneurysm Father    Colon cancer Neg Hx    Colon polyps Neg Hx    Esophageal cancer Neg Hx    Rectal cancer Neg Hx    Stomach cancer Neg Hx     Immunization History  Administered Date(s) Administered   PFIZER(Purple Top)SARS-COV-2 Vaccination 09/25/2020    Review of systems: Positive Findings in bold print.  Constitutional:  chills, fatigue, fever, sweats, weight change Communication: Optometrist, sign Ecologist, hand writing, iPad/Android device Head: headaches, head injury Eyes: changes in vision, eye pain, glaucoma, cataracts, macular degeneration, diplopia, glare,  light sensitivity, eyeglasses or contacts, blindness Ears nose mouth throat: hearing impaired, hearing aids,  ringing in ears, deaf, sign language,  vertigo, nosebleeds,  rhinitis,  cold sores, snoring, swollen glands Cardiovascular: HTN, edema, arrhythmia, pacemaker in place, defibrillator in place, chest pain/tightness, chronic anticoagulation, blood clot, heart failure, MI Peripheral Vascular: leg cramps, varicose veins, blood clots, lymphedema, varicosities Respiratory:  asthma, difficulty breathing, denies congestion, SOB, wheezing, cough, emphysema Gastrointestinal: change in appetite or weight, abdominal pain, constipation, diarrhea, nausea, vomiting, vomiting blood, change in bowel habits, abdominal pain, jaundice, rectal bleeding, hemorrhoids, GERD Genitourinary:  nocturia,  pain on urination, polyuria,  blood in urine, Foley catheter, urinary urgency, ESRD on hemodialysis Musculoskeletal: amputation, cramping, stiff joints, painful joints, decreased joint motion, fractures, OA, gout, hemiplegia, paraplegia, uses cane, wheelchair bound, uses walker, uses rollator Skin: +changes in toenails, color change, dryness, itching, mole changes,  rash, wound(s) Neurological: headaches, numbness  in feet, paresthesias in feet, burning in feet, fainting,  seizures, change in speech, migraines, memory problems/poor historian, cerebral palsy, weakness, paralysis, CVA, TIA Endocrine: diabetes, hypothyroidism, hyperthyroidism,  goiter, dry mouth, flushing, heat intolerance, cold intolerance,  excessive thirst, denies polyuria,  nocturia Hematological:  easy bleeding, excessive bleeding, easy bruising, enlarged lymph nodes, on long term blood thinner, history of past transusions Allergy/immunological:  hives, eczema, frequent infections, multiple drug allergies, seasonal allergies, transplant recipient, multiple food allergies Psychiatric:  anxiety, depression, mood disorder, suicidal ideations, hallucinations, insomnia  Objective: There were no vitals filed for this visit. Vascular Examination: Capillary refill time less than 3 seconds x 10 digits.  Dorsalis pedis pulses 2 out of 4.  Posterior tibial pulses 2 out of 4.  Digital hair not present x 10 digits.  Skin temperature gradient WNL b/l.  Dermatological Examination: Skin with normal turgor, texture and tone b/l  Toenails 1-5 b/l discolored, thick, dystrophic with subungual debris and pain with palpation to nailbeds due to thickness of nails.  Musculoskeletal: Muscle strength 5/5 to all LE muscle groups.  Pes planovalgus foot structure noted with calcaneovalgus to many toe signs partially reproduced with dorsiflexion of the hallux.  Neurological: Sensation intact with 10 gram monofilament.  Vibratory sensation  intact.  Assessment: NIDDM Encounter for diabetic foot examination Pes planovalgus  Plan: Discussed diabetic foot care principles. Literature dispensed on today. Patient to continue soft, supportive shoe gear daily. Patient to report any pedal injuries to medical professional immediately. Follow up one year. Patient/POA to call should there be a concern in the interim.. Given the pes planovalgus as present in  setting of diabetes I believe patient will benefit from orthotics.  She can come in for casting of the nursing schedule if she feels comfortable.

## 2022-11-29 ENCOUNTER — Other Ambulatory Visit: Payer: Self-pay

## 2022-11-29 ENCOUNTER — Encounter (HOSPITAL_BASED_OUTPATIENT_CLINIC_OR_DEPARTMENT_OTHER): Payer: Self-pay | Admitting: Emergency Medicine

## 2022-11-29 ENCOUNTER — Emergency Department (HOSPITAL_BASED_OUTPATIENT_CLINIC_OR_DEPARTMENT_OTHER): Payer: 59

## 2022-11-29 ENCOUNTER — Emergency Department (HOSPITAL_BASED_OUTPATIENT_CLINIC_OR_DEPARTMENT_OTHER)
Admission: EM | Admit: 2022-11-29 | Discharge: 2022-11-29 | Disposition: A | Discharge status: Home / Self Care | Payer: 59 | Attending: Emergency Medicine | Admitting: Emergency Medicine

## 2022-11-29 DIAGNOSIS — R0789 Other chest pain: Secondary | ICD-10-CM | POA: Diagnosis not present

## 2022-11-29 DIAGNOSIS — R079 Chest pain, unspecified: Secondary | ICD-10-CM

## 2022-11-29 DIAGNOSIS — Z91040 Latex allergy status: Secondary | ICD-10-CM | POA: Insufficient documentation

## 2022-11-29 LAB — BASIC METABOLIC PANEL
Anion gap: 10 (ref 5–15)
BUN: 18 mg/dL (ref 6–20)
CO2: 25 mmol/L (ref 22–32)
Calcium: 9.1 mg/dL (ref 8.9–10.3)
Chloride: 106 mmol/L (ref 98–111)
Creatinine, Ser: 1.19 mg/dL — ABNORMAL HIGH (ref 0.44–1.00)
GFR, Estimated: 55 mL/min — ABNORMAL LOW (ref >60)
Glucose, Bld: 94 mg/dL (ref 70–99)
Potassium: 3.7 mmol/L (ref 3.5–5.1)
Sodium: 141 mmol/L (ref 135–145)

## 2022-11-29 LAB — CBC
HCT: 38.8 % (ref 36.0–46.0)
Hemoglobin: 12.2 g/dL (ref 12.0–15.0)
MCH: 23.4 pg — ABNORMAL LOW (ref 26.0–34.0)
MCHC: 31.4 g/dL (ref 30.0–36.0)
MCV: 74.5 fL — ABNORMAL LOW (ref 80.0–100.0)
Platelets: 284 10*3/uL (ref 150–400)
RBC: 5.21 MIL/uL — ABNORMAL HIGH (ref 3.87–5.11)
RDW: 15.9 % — ABNORMAL HIGH (ref 11.5–15.5)
WBC: 5.3 10*3/uL (ref 4.0–10.5)
nRBC: 0 % (ref 0.0–0.2)

## 2022-11-29 LAB — MAGNESIUM: Magnesium: 1.9 mg/dL (ref 1.7–2.4)

## 2022-11-29 LAB — TROPONIN I (HIGH SENSITIVITY)
Troponin I (High Sensitivity): 2 ng/L (ref <18)
Troponin I (High Sensitivity): 2 ng/L (ref <18)

## 2022-11-29 LAB — LIPASE, BLOOD: Lipase: 34 U/L (ref 11–51)

## 2022-11-29 NOTE — ED Provider Notes (Signed)
[facility] EMERGENCY DEPARTMENT Provider Note   CSN: [id number] Arrival date & time: 11/29/22  1345     History  Chief Complaint  Patient presents with   Chest Pain    Sierra Pearson is a 51 y.o. female.  51 year old female who presents the ER today secondary to pain.  Patient states that she thinks it probably started in her back and maybe it radiates towards her chest.  She states that it feels somewhat like pressure but also feels sharp in her back.  States she had something similar just in her chest previously that was indigestion but this is also in her back which is different than previously.  She had a mild nonproductive cough but no fevers.  No trauma.  Did not lose anything heavy.  No dyspnea, lightheadedness, new diaphoresis.  No changes with exertion.  Has been going on for about 36 hours now. No rashes. No LE edema or pain.   Chest Pain      Home Medications Prior to Admission medications   Medication Sig Start Date End Date Taking? Authorizing Provider  ALPRAZolam Duanne Moron) 0.5 MG tablet for sedation before MRI scan; take 1 tab 1 hour before scan; may repeat 1 tab 15 min before scan; needs driver for scan [birth year]   Penumalli, Bonnita Levan R, MD  ATORVASTATIN CALCIUM PO Take by mouth.    [provider]  diclofenac (VOLTAREN) 75 MG EC tablet Take 75 mg by mouth 2 (two) times daily.    [provider]  DULoxetine (CYMBALTA) 30 MG capsule Take 1 capsule (30 mg total) by mouth daily. 07/30/21   Marcial Pacas, MD  gabapentin (NEURONTIN) 100 MG capsule Take 100 mg by mouth 3 (three) times daily. She is taking two capsules in the morning and one capsule at bedtime. 03/17/21   [provider]  hydrochlorothiazide (MICROZIDE) 12.5 MG capsule Take 1 capsule by mouth daily. 01/10/21   [provider]  potassium chloride (K-DUR,KLOR-CON) 10 MEQ tablet Take 10 mEq by mouth 2 (two) times daily.    [provider]      Allergies     Adhesive  [tape], Bee pollen, Wound dressing adhesive, Latex, Other, and Penicillins    Review of Systems   Review of Systems  Cardiovascular:  Positive for chest pain.    Physical Exam Updated Vital Signs BP (!) 172/106 (BP Location: Right Arm)   Pulse 79   Temp 98.6 F (37 C) (Oral)   Resp 16   Ht '5\' 5"'$  (1.651 m)   Wt 95.7 kg   SpO2 100%   BMI 35.11 kg/m  Physical Exam Vitals and nursing note reviewed.  Constitutional:      Appearance: She is well-developed.  HENT:     Head: Normocephalic and atraumatic.  Cardiovascular:     Rate and Rhythm: Normal rate and regular rhythm.     Heart sounds: Normal heart sounds.  Pulmonary:     Effort: No respiratory distress.     Breath sounds: No stridor.  Abdominal:     General: There is no distension.  Musculoskeletal:     Cervical back: Normal range of motion.     Right lower leg: No tenderness. No edema.     Left lower leg: No tenderness. No edema.  Skin:    General: Skin is warm and dry.  Neurological:     Mental Status: She is alert.     ED Results / Procedures / Treatments   Labs (all labs  ordered are listed, but only abnormal results are displayed) Labs Reviewed  BASIC METABOLIC PANEL - Abnormal; Notable for the following components:      Result Value   Creatinine, Ser 1.19 (*)    GFR, Estimated 55 (*)    All other components within normal limits  CBC - Abnormal; Notable for the following components:   RBC 5.21 (*)    MCV 74.5 (*)    MCH 23.4 (*)    RDW 15.9 (*)    All other components within normal limits  MAGNESIUM  LIPASE, BLOOD  TROPONIN I (HIGH SENSITIVITY)  TROPONIN I (HIGH SENSITIVITY)    EKG EKG Interpretation  Date/Time:  Sunday November 29 2022 13:56:24 EST Ventricular Rate:  88 PR Interval:  152 QRS Duration: 66 QT Interval:  494 QTC Calculation: 598 R Axis:   32 Text Interpretation: Sinus rhythm Abnormal R-wave progression, early transition Nonspecific T abnormalities, lateral leads  Prolonged QT interval Confirmed by Merrily Pew [phone]) on 11/29/2022 7:39:22 PM  Radiology DG Chest 2 View  Result Date: 11/29/2022 CLINICAL DATA:  Chest pain EXAM: CHEST - 2 VIEW COMPARISON:  05/14/2020 FINDINGS: Cardiac size is within normal limits. There are no signs of pulmonary edema or focal pulmonary consolidation. There is no pleural effusion or pneumothorax. There is anterior surgical fusion in lower cervical spine. Possible degenerative changes are noted in left shoulder. IMPRESSION: There are no signs of pulmonary edema or focal pulmonary consolidation. Electronically Signed   By: Elmer Picker M.D.   On: 11/29/2022 14:21    Procedures Procedures    Medications Ordered in ED Medications - No data to display  ED Course/ Medical Decision Making/ A&P                           Medical Decision Making Amount and/or Complexity of Data Reviewed Labs: ordered. Radiology: ordered.  QTc on the EKG is calculated as being high however on manual calculation is less than 500.  Her EKG does not show any signs of ischemia.  Troponin is negative.  Considered aortic dissection with her elevated blood pressure in the back pain and chest pain however she has a normal mediastinum, normal radial pulses and appears very comfortable and waited 36 hours for evaluation so I think is unlikely at this time.  Considered PE however no shortness of breath no risk factors.  Doubt pneumonia.  Will add on a lipase that she did have some alcohol recently to make sure not pancreatitis but she does have a gallbladder no need for evaluating that. Lipase ok. Trops negative.  Stable for discharge with pcp follo wup.  Final Clinical Impression(s) / ED Diagnoses Final diagnoses:  Nonspecific chest pain    Rx / DC Orders ED Discharge Orders     None         Jamez Ambrocio, Corene Cornea, MD 11/29/22 [phone]

## 2022-11-29 NOTE — ED Notes (Signed)
Provider bedside.

## 2022-11-29 NOTE — ED Triage Notes (Signed)
Pt arrives pov, slow gait c/o mid sternal CP with RT posterior shoulder radiation and shob x 2 days. Also endorses intermittent  cough

## 2023-08-01 HISTORY — PX: BACK SURGERY: SHX140

## 2024-04-03 ENCOUNTER — Other Ambulatory Visit: Payer: Self-pay

## 2024-04-03 ENCOUNTER — Encounter (HOSPITAL_COMMUNITY): Payer: Self-pay | Admitting: Ophthalmology

## 2024-04-03 NOTE — Progress Notes (Signed)
 SDW CALL  Patient was given pre-op instructions over the phone. The opportunity was given for the patient to ask questions. No further questions asked. Patient verbalized understanding of instructions given.   PCP - Ballard Bongo Cardiologist - denies  PPM/ICD - denies   Chest x-ray - denies EKG - 08/25/23 - CE - requested  Stress Test - denies ECHO - denies Cardiac Cath - denies  Sleep Study - denies   Fasting Blood Sugar - less 100 Checks Blood Sugar once every couple of days  Last dose of GLP1 agonist-  last dose of Mounjaro was 5/1 - patient instructed not to take dose this week   Blood Thinner Instructions: n/a Aspirin Instructions: n/a  ERAS Protcol -   NPO  COVID TEST- n/a   Anesthesia review: yes - Mounjaro  Patient denies shortness of breath, fever, cough and chest pain over the phone call   All instructions explained to the patient, with a verbal understanding of the material. Patient agrees to go over the instructions while at home for a better understanding.    Patient states that Dr. Lydia Sams told her she was working on the left eye but patient stated she thought she was having issues with the right eye.   I reached out to Dr. Lydia Sams for clarification and I asked the patient to reach out to Dr. Lydia Sams.

## 2024-04-04 NOTE — Anesthesia Preprocedure Evaluation (Signed)
 Anesthesia Evaluation  Patient identified by MRN, date of birth, ID band Patient awake    Reviewed: Allergy & Precautions, H&P , NPO status , Patient's Chart, lab work & pertinent test results  Airway Mallampati: II  TM Distance: >3 FB Neck ROM: Full    Dental no notable dental hx.    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    GI/Hepatic Neg liver ROS, PUD,GERD  ,,  Endo/Other  diabetes, Type 2    Renal/GU negative Renal ROS  negative genitourinary   Musculoskeletal negative musculoskeletal ROS (+)    Abdominal   Peds negative pediatric ROS (+)  Hematology negative hematology ROS (+)   Anesthesia Other Findings  EXOTROPIA  Reproductive/Obstetrics negative OB ROS                              Anesthesia Physical Anesthesia Plan  ASA: 3  Anesthesia Plan: General   Post-op Pain Management: Tylenol  PO (pre-op)*   Induction: Intravenous  PONV Risk Score and Plan: 3 and Ondansetron, Dexamethasone, Midazolam and Treatment may vary due to age or medical condition  Airway Management Planned: Oral ETT  Additional Equipment:   Intra-op Plan:   Post-operative Plan: Extubation in OR  Informed Consent: I have reviewed the patients History and Physical, chart, labs and discussed the procedure including the risks, benefits and alternatives for the proposed anesthesia with the patient or authorized representative who has indicated his/her understanding and acceptance.     Dental advisory given  Plan Discussed with: CRNA  Anesthesia Plan Comments: (PAT note written 04/04/2024 by Nizhoni Parlow, PA-C.  )        Anesthesia  Quick Evaluation

## 2024-04-04 NOTE — Progress Notes (Signed)
 Anesthesia Chart Review:  Case: [id number] Date/Time: 04/06/24 0715   Procedure: REPAIR STRABISMUS (Left)   Anesthesia type: General   Pre-op diagnosis: EXOTROPIA   Location: [facility] ROOM 08 / [facility] OR   Surgeons: [name], MD       DISCUSSION: Patient is a 53 year old female scheduled for the above procedure.   History includes former smoker, HTN, hypercholesterolemia, DM2, spinal surgery (L3-5 TLIF 08/30/23).  She reported last Mounjaro was on 03/30/24. Case is posted for general anesthesia. Anesthesia team to evaluate on the day of surgery.    VS: LMP 06/26/2012  BP Readings from Last 3 Encounters:  11/29/22 (!) 172/106  03/31/21 (!) 141/93  03/20/21 (!) 150/93   Pulse Readings from Last 3 Encounters:  11/29/22 79  03/31/21 66  03/20/21 62     PROVIDERS: Angelia Kelp, NP is PCP    LABS: For day of surgery as indicated. As of 08/31/23, glucose 124, Cr 0.74, A1c 5.9%, H/H 10.4/32.5, PLT 225.    EKG: 08/24/23: Tracing requested.  Sinus rhythm  Possible Left atrial enlargement  Possible LVH   R in aVL   Nonspecific T wave changes  When compared with ECG of 11-Aug-2021 17 16,  Premature ventricular complexes are no longer present  Confirmed by Marylouise Socks  [phone]  on 08-25-2023 8 06 29 AM    CV: N/A  Past Medical History:  Diagnosis Date   Diabetes mellitus without complication (HCC)    High cholesterol    Hypertension    Low back pain     Past Surgical History:  Procedure Laterality Date   ANKLE SURGERY Right    BACK SURGERY  08/2023   CHOLECYSTECTOMY     KNEE SURGERY Bilateral    NECK SURGERY      MEDICATIONS: No current facility-administered medications for this encounter.    acetaminophen  (TYLENOL ) 500 MG tablet   baclofen (LIORESAL) 10 MG tablet   hydrochlorothiazide (MICROZIDE) 12.5 MG capsule   MOUNJARO 15 MG/0.5ML Pen   potassium chloride  (K-DUR,KLOR-CON ) 10 MEQ tablet   pregabalin (LYRICA) 75 MG capsule    Ella Gun, PA-C Surgical  Short Stay/Anesthesiology [facility] Phone [phone] [address] [facility] Phone [phone] 04/04/2024 2:55 PM

## 2024-04-05 ENCOUNTER — Ambulatory Visit: Payer: Self-pay | Admitting: Ophthalmology

## 2024-04-05 NOTE — H&P (View-Only) (Signed)
 Date of examination:  03-07-24  Indication for surgery: diplopia  Pertinent past medical history:  Past Medical History:  Diagnosis Date   Diabetes mellitus without complication (HCC)    High cholesterol    Hypertension    Low back pain     Pertinent ocular history:  Longstanding history of blurry vision that becomes double in right gaze and left head tilt. Patient reports onset after one of several spinal surgeries; had anterior cervical surgery and one eye was "weak when [she] woke up from surgery". history of multiple cancers, and need for several spinal fusion surgeries.  Pertinent family history:  Family History  Problem Relation Age of Onset   Breast cancer Mother    Aneurysm Father    Colon cancer Neg Hx    Colon polyps Neg Hx    Esophageal cancer Neg Hx    Rectal cancer Neg Hx    Stomach cancer Neg Hx     General:  Patient needing a walker, in no distress.    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

## 2024-04-05 NOTE — H&P (Signed)
 Date of examination:  03-07-24  Indication for surgery: diplopia  Pertinent past medical history:  Past Medical History:  Diagnosis Date   Diabetes mellitus without complication (HCC)    High cholesterol    Hypertension    Low back pain     Pertinent ocular history:  Longstanding history of blurry vision that becomes double in right gaze and left head tilt. Patient reports onset after one of several spinal surgeries; had anterior cervical surgery and one eye was "weak when [she] woke up from surgery". history of multiple cancers, and need for several spinal fusion surgeries.  Pertinent family history:  Family History  Problem Relation Age of Onset   Breast cancer Mother    Aneurysm Father    Colon cancer Neg Hx    Colon polyps Neg Hx    Esophageal cancer Neg Hx    Rectal cancer Neg Hx    Stomach cancer Neg Hx     General:  Patient needing a walker, in no distress.    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

## 2024-04-06 ENCOUNTER — Ambulatory Visit (HOSPITAL_COMMUNITY)
Admission: RE | Admit: 2024-04-06 | Discharge: 2024-04-06 | Disposition: A | Discharge status: Home / Self Care | Payer: 59 | Attending: Ophthalmology | Admitting: Ophthalmology

## 2024-04-06 ENCOUNTER — Encounter (HOSPITAL_COMMUNITY)
Admission: RE | Disposition: A | Discharge status: Home / Self Care | Payer: Self-pay | Source: Home / Self Care | Attending: Ophthalmology

## 2024-04-06 ENCOUNTER — Ambulatory Visit (HOSPITAL_BASED_OUTPATIENT_CLINIC_OR_DEPARTMENT_OTHER): Payer: Self-pay | Admitting: Vascular Surgery

## 2024-04-06 ENCOUNTER — Encounter (HOSPITAL_COMMUNITY): Payer: Self-pay | Admitting: Ophthalmology

## 2024-04-06 ENCOUNTER — Ambulatory Visit (HOSPITAL_COMMUNITY): Payer: Self-pay | Admitting: Vascular Surgery

## 2024-04-06 DIAGNOSIS — H5010 Unspecified exotropia: Secondary | ICD-10-CM | POA: Diagnosis not present

## 2024-04-06 DIAGNOSIS — I10 Essential (primary) hypertension: Secondary | ICD-10-CM | POA: Insufficient documentation

## 2024-04-06 DIAGNOSIS — Z8711 Personal history of peptic ulcer disease: Secondary | ICD-10-CM | POA: Diagnosis not present

## 2024-04-06 DIAGNOSIS — H5052 Exophoria: Secondary | ICD-10-CM | POA: Diagnosis present

## 2024-04-06 DIAGNOSIS — H4912 Fourth [trochlear] nerve palsy, left eye: Secondary | ICD-10-CM | POA: Insufficient documentation

## 2024-04-06 DIAGNOSIS — Z87891 Personal history of nicotine dependence: Secondary | ICD-10-CM | POA: Insufficient documentation

## 2024-04-06 DIAGNOSIS — Z7985 Long-term (current) use of injectable non-insulin antidiabetic drugs: Secondary | ICD-10-CM | POA: Insufficient documentation

## 2024-04-06 DIAGNOSIS — E119 Type 2 diabetes mellitus without complications: Secondary | ICD-10-CM

## 2024-04-06 DIAGNOSIS — K219 Gastro-esophageal reflux disease without esophagitis: Secondary | ICD-10-CM | POA: Diagnosis not present

## 2024-04-06 DIAGNOSIS — E785 Hyperlipidemia, unspecified: Secondary | ICD-10-CM

## 2024-04-06 HISTORY — PX: STRABISMUS SURGERY: SHX218

## 2024-04-06 HISTORY — DX: Type 2 diabetes mellitus without complications: E11.9

## 2024-04-06 LAB — CBC
HCT: 37.1 % (ref 36.0–46.0)
Hemoglobin: 11.5 g/dL — ABNORMAL LOW (ref 12.0–15.0)
MCH: 23.2 pg — ABNORMAL LOW (ref 26.0–34.0)
MCHC: 31 g/dL (ref 30.0–36.0)
MCV: 74.8 fL — ABNORMAL LOW (ref 80.0–100.0)
Platelets: 272 10*3/uL (ref 150–400)
RBC: 4.96 MIL/uL (ref 3.87–5.11)
RDW: 15.9 % — ABNORMAL HIGH (ref 11.5–15.5)
WBC: 4 10*3/uL (ref 4.0–10.5)
nRBC: 0 % (ref 0.0–0.2)

## 2024-04-06 LAB — BASIC METABOLIC PANEL WITH GFR
Anion gap: 7 (ref 5–15)
BUN: 17 mg/dL (ref 6–20)
CO2: 26 mmol/L (ref 22–32)
Calcium: 8.8 mg/dL — ABNORMAL LOW (ref 8.9–10.3)
Chloride: 106 mmol/L (ref 98–111)
Creatinine, Ser: 0.88 mg/dL (ref 0.44–1.00)
GFR, Estimated: >60 mL/min (ref >60)
Glucose, Bld: 86 mg/dL (ref 70–99)
Potassium: 3.7 mmol/L (ref 3.5–5.1)
Sodium: 139 mmol/L (ref 135–145)

## 2024-04-06 LAB — GLUCOSE, CAPILLARY
Glucose-Capillary: 88 mg/dL (ref 70–99)
Glucose-Capillary: 94 mg/dL (ref 70–99)

## 2024-04-06 SURGERY — REPAIR STRABISMUS
Anesthesia: General | Site: Eye | Laterality: Left

## 2024-04-06 MED ORDER — NEOMYCIN-POLYMYXIN-DEXAMETH 3.5-10000-0.1 OP OINT
TOPICAL_OINTMENT | OPHTHALMIC | Status: AC
  Filled 2024-04-06: qty 3.5

## 2024-04-06 MED ORDER — FENTANYL CITRATE (PF) 250 MCG/5ML IJ SOLN
INTRAMUSCULAR | Status: DC | PRN
  Administered 2024-04-06: 100 ug via INTRAVENOUS
  Administered 2024-04-06: 50 ug via INTRAVENOUS

## 2024-04-06 MED ORDER — INSULIN ASPART 100 UNIT/ML IJ SOLN: 0.0000 [IU] | INTRAMUSCULAR | Status: DC | PRN

## 2024-04-06 MED ORDER — ONDANSETRON HCL 4 MG/2ML IJ SOLN
INTRAMUSCULAR | Status: DC | PRN
  Administered 2024-04-06: 4 mg via INTRAVENOUS

## 2024-04-06 MED ORDER — BSS IO SOLN
INTRAOCULAR | Status: AC
  Filled 2024-04-06: qty 15

## 2024-04-06 MED ORDER — PHENYLEPHRINE 80 MCG/ML (10ML) SYRINGE FOR IV PUSH (FOR BLOOD PRESSURE SUPPORT)
PREFILLED_SYRINGE | INTRAVENOUS | Status: DC | PRN
  Administered 2024-04-06: 160 ug via INTRAVENOUS

## 2024-04-06 MED ORDER — SUGAMMADEX SODIUM 200 MG/2ML IV SOLN
INTRAVENOUS | Status: DC | PRN
  Administered 2024-04-06: 300 mg via INTRAVENOUS

## 2024-04-06 MED ORDER — PROPOFOL 10 MG/ML IV BOLUS
INTRAVENOUS | Status: DC | PRN
  Administered 2024-04-06: 150 mg via INTRAVENOUS

## 2024-04-06 MED ORDER — CHLORHEXIDINE GLUCONATE 0.12 % MT SOLN
15.0000 mL | Freq: Once | OROMUCOSAL | Status: AC
  Administered 2024-04-06: 15 mL via OROMUCOSAL
  Filled 2024-04-06: qty 15

## 2024-04-06 MED ORDER — MIDAZOLAM HCL 2 MG/2ML IJ SOLN
INTRAMUSCULAR | Status: AC
  Filled 2024-04-06: qty 2

## 2024-04-06 MED ORDER — DROPERIDOL 2.5 MG/ML IJ SOLN
0.6250 mg | Freq: Once | INTRAMUSCULAR | Status: AC | PRN
  Administered 2024-04-06: 0.625 mg via INTRAVENOUS

## 2024-04-06 MED ORDER — PHENYLEPHRINE HCL 10 % OP SOLN
1.0000 [drp] | Freq: Once | OPHTHALMIC | Status: AC
Start: 2024-04-06 — End: 2024-04-06
  Administered 2024-04-06: 1 [drp] via OPHTHALMIC
  Filled 2024-04-06: qty 5

## 2024-04-06 MED ORDER — FENTANYL CITRATE (PF) 100 MCG/2ML IJ SOLN: 25.0000 ug | INTRAMUSCULAR | Status: DC | PRN

## 2024-04-06 MED ORDER — PHENYLEPHRINE HCL 2.5 % OP SOLN
OPHTHALMIC | Status: AC
  Filled 2024-04-06: qty 2

## 2024-04-06 MED ORDER — BUPIVACAINE HCL (PF) 0.5 % IJ SOLN
INTRAMUSCULAR | Status: AC
  Filled 2024-04-06: qty 30

## 2024-04-06 MED ORDER — ORAL CARE MOUTH RINSE: 15.0000 mL | Freq: Once | OROMUCOSAL | Status: AC

## 2024-04-06 MED ORDER — ROCURONIUM BROMIDE 10 MG/ML (PF) SYRINGE
PREFILLED_SYRINGE | INTRAVENOUS | Status: DC | PRN
  Administered 2024-04-06: 60 mg via INTRAVENOUS

## 2024-04-06 MED ORDER — DROPERIDOL 2.5 MG/ML IJ SOLN
INTRAMUSCULAR | Status: AC
  Filled 2024-04-06: qty 2

## 2024-04-06 MED ORDER — DEXAMETHASONE SODIUM PHOSPHATE 10 MG/ML IJ SOLN
INTRAMUSCULAR | Status: DC | PRN
  Administered 2024-04-06: 10 mg via INTRAVENOUS

## 2024-04-06 MED ORDER — LIDOCAINE 2% (20 MG/ML) 5 ML SYRINGE
INTRAMUSCULAR | Status: DC | PRN
  Administered 2024-04-06: 100 mg via INTRAVENOUS

## 2024-04-06 MED ORDER — BUPIVACAINE HCL (PF) 0.5 % IJ SOLN
INTRAMUSCULAR | Status: DC | PRN
  Administered 2024-04-06: 2 mL

## 2024-04-06 MED ORDER — ACETAMINOPHEN 500 MG PO TABS
1000.0000 mg | ORAL_TABLET | Freq: Once | ORAL | Status: AC
  Administered 2024-04-06: 1000 mg via ORAL
  Filled 2024-04-06: qty 2

## 2024-04-06 MED ORDER — SODIUM CHLORIDE 0.9 % IV SOLN: INTRAVENOUS | Status: DC

## 2024-04-06 MED ORDER — BSS IO SOLN
INTRAOCULAR | Status: DC | PRN
  Administered 2024-04-06: 15 mL via INTRAOCULAR

## 2024-04-06 MED ORDER — PROPOFOL 10 MG/ML IV BOLUS
INTRAVENOUS | Status: AC
  Filled 2024-04-06: qty 20

## 2024-04-06 MED ORDER — OXYCODONE HCL 5 MG/5ML PO SOLN: 5.0000 mg | Freq: Once | ORAL | Status: DC | PRN

## 2024-04-06 MED ORDER — OXYCODONE HCL 5 MG PO TABS: 5.0000 mg | ORAL_TABLET | Freq: Once | ORAL | Status: DC | PRN

## 2024-04-06 MED ORDER — MIDAZOLAM HCL 2 MG/2ML IJ SOLN
INTRAMUSCULAR | Status: DC | PRN
  Administered 2024-04-06: 2 mg via INTRAVENOUS

## 2024-04-06 MED ORDER — NEOMYCIN-POLYMYXIN-DEXAMETH 3.5-10000-0.1 OP OINT
TOPICAL_OINTMENT | OPHTHALMIC | Status: DC | PRN
  Administered 2024-04-06: 1 via OPHTHALMIC

## 2024-04-06 MED ORDER — ACETAMINOPHEN 10 MG/ML IV SOLN: 1000.0000 mg | Freq: Once | INTRAVENOUS | Status: DC | PRN

## 2024-04-06 MED ORDER — LACTATED RINGERS IV SOLN: INTRAVENOUS | Status: DC | PRN

## 2024-04-06 MED ORDER — FENTANYL CITRATE (PF) 250 MCG/5ML IJ SOLN
INTRAMUSCULAR | Status: AC
  Filled 2024-04-06: qty 5

## 2024-04-06 MED ORDER — TOBRAMYCIN-DEXAMETHASONE 0.3-0.1 % OP SUSP: 1.0000 [drp] | Freq: Four times a day (QID) | OPHTHALMIC | 0 refills | Status: AC

## 2024-04-06 MED ORDER — CELECOXIB 200 MG PO CAPS
200.0000 mg | ORAL_CAPSULE | Freq: Once | ORAL | Status: AC
  Administered 2024-04-06: 200 mg via ORAL
  Filled 2024-04-06: qty 1

## 2024-04-06 SURGICAL SUPPLY — 26 items
APPLICATOR DR MATTHEWS STRL (MISCELLANEOUS) ×1 IMPLANT
BAG COUNTER SPONGE SURGICOUNT (BAG) ×1 IMPLANT
BNDG EYE OVAL 2 1/8 X 2 5/8 (GAUZE/BANDAGES/DRESSINGS) IMPLANT
CAUTERY EYE LOW TEMP 1300F FIN (OPHTHALMIC RELATED) IMPLANT
CORD BIPOLAR FORCEPS 12FT (ELECTRODE) ×1 IMPLANT
COVER BACK TABLE 60X90IN (DRAPES) ×1 IMPLANT
COVER MAYO STAND STRL (DRAPES) ×1 IMPLANT
COVER SURGICAL LIGHT HANDLE (MISCELLANEOUS) ×1 IMPLANT
DRAPE EENT ADH APERT 15X15 STR (DRAPES) IMPLANT
DRAPE SURG 17X23 STRL (DRAPES) ×1 IMPLANT
DRAPE SURG ORHT 6 SPLT 77X108 (DRAPES) ×1 IMPLANT
GLOVE BIO SURGEON STRL SZ7 (GLOVE) ×1 IMPLANT
GOWN STRL REUS W/ TWL LRG LVL3 (GOWN DISPOSABLE) ×2 IMPLANT
NDL 18GX1X1/2 (RX/OR ONLY) (NEEDLE) ×1 IMPLANT
NEEDLE 18GX1X1/2 (RX/OR ONLY) (NEEDLE) ×1 IMPLANT
NS IRRIG 1000ML POUR BTL (IV SOLUTION) ×1 IMPLANT
PACK SRG BSC III STRL LF ECLPS (CUSTOM PROCEDURE TRAY) IMPLANT
SHIELD EYE PEDIATRIC STRL (MISCELLANEOUS) ×2 IMPLANT
SPEAR EYE SURG WECK-CEL (MISCELLANEOUS) ×1 IMPLANT
STRIP CLOSURE SKIN 1/4X4 (GAUZE/BANDAGES/DRESSINGS) IMPLANT
SUT CHROMIC 7 0 TG140 8 (SUTURE) ×1 IMPLANT
SUT SILK 4 0 RB 1 (SUTURE) IMPLANT
SUT VICRYL ABS 6-0 S29 18IN (SUTURE) ×1 IMPLANT
SYR 10ML LL (SYRINGE) ×1 IMPLANT
SYR 3ML LL SCALE MARK (SYRINGE) ×1 IMPLANT
TOWEL GREEN STERILE FF (TOWEL DISPOSABLE) ×1 IMPLANT

## 2024-04-06 NOTE — Anesthesia Procedure Notes (Signed)
 Procedure Name: Intubation Date/Time: 04/06/2024 7:54 AM  Performed by: Hershall Lory, CRNAPre-anesthesia Checklist: Patient identified, Emergency Drugs available, Suction available and Patient being monitored Patient Re-evaluated:Patient Re-evaluated prior to induction Oxygen Delivery Method: Circle system utilized Preoxygenation: Pre-oxygenation with 100% oxygen Induction Type: IV induction Ventilation: Mask ventilation without difficulty Tube type: Oral Tube size: 7.0 mm Number of attempts: 1 Airway Equipment and Method: Stylet and Oral airway Placement Confirmation: ETT inserted through vocal cords under direct vision, positive ETCO2 and breath sounds checked- equal and bilateral Secured at: 23 cm Tube secured with: Tape Dental Injury: Teeth and Oropharynx as per pre-operative assessment

## 2024-04-06 NOTE — Anesthesia Postprocedure Evaluation (Signed)
 Anesthesia Post Note  Patient: Sierra Pearson  Procedure(s) Performed: REPAIR STRABISMUS (Left: Eye)     Patient location during evaluation: PACU Anesthesia Type: General Level of consciousness: awake and alert Pain management: pain level controlled Vital Signs Assessment: post-procedure vital signs reviewed and stable Respiratory status: spontaneous breathing, nonlabored ventilation, respiratory function stable and patient connected to nasal cannula oxygen Cardiovascular status: blood pressure returned to baseline and stable Postop Assessment: no apparent nausea or vomiting Anesthetic complications: no   No notable events documented.  Last Vitals:  Vitals:   04/06/24 0915 04/06/24 0930  BP: 111/76 118/74  Pulse: (!) 56 (!) 56  Resp: 12 14  Temp:  (!) 36.2 C  SpO2: 100% 100%    Last Pain:  Vitals:   04/06/24 0915  TempSrc:   PainSc: 0-No pain                 Lethaniel Rave

## 2024-04-06 NOTE — Op Note (Signed)
 04/06/2024  8:41 AM  PATIENT:  Sierra Pearson  53 y.o. 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  Inferior oblique muscle recession, left  SURGEON: Audrie Blind, M.D.   ANESTHESIA:  General LMA and subTenons Marcaine  COMPLICATIONS: None immediate  DESCRIPTION OF PROCEDURE: The patient was taken to the operating room where She was identified by me. General anesthesia was induced without difficulty after placement of appropriate monitors. The left eye was prepped and draped in standard sterile fashion. Maxitrol ointment was placed on the left eye eye for corneal protection during the case. A lid speculum was placed in the left eye. Forced ductions were unremarkable.  Through an inferotemporal fornix incision through conjunctiva and Tenon's fascia, the left lateral rectus muscle was engaged on a Gass hook, which was used to draw a traction suture of 4-0 silk under the muscle. This was used to pull the eye up and in. Using 2 muscle hooks through the conjunctival incision for exposure, the left inferior oblique muscle was identified and engaged on oblique hook. It was drawn forward and cleared of its fascial attachments of the way to its insertion, which was secured with a fine curved hemostat. Its cut end was secured with a double-armed 6-0 Vicryl suture, with a double locking bite at each border of the muscle, 1 mm from the insertion. The muscle was disinserted. Further inspection of the muscle revealed a second, bifid arm. This second arm of the inferior oblique was likewise isolated, secured and disinserted. Hemostasis was achieved at the disinsertion point with light cautery, with visual confirmation of good hemostasis. The left inferior rectus muscle was engaged on a series of muscle hooks. A mark was made on the sclera 3 mm posterior and 3 mm temporal to the temporal border of the inferior rectus insertion.  This was used as the exit point for the pole sutures of the inferior oblique, which were passed in crossed swords fashion and tied securely. The suture ends were tied securely after the position of the muscle had been checked and found to be accurate.   The inferior rectus insertion and the lateral rectus insertion were confirmed intact and the inferior oblique muscle visualized to ensure good hemostasis.  The traction suture was removed. 1.5mL of marcaine was instilled into the subTenon's space via the wound. The wound was closed with two 7-0 Chromic sutures. The patient was awakened without difficulty and taken to the recovery room in stable condition, having suffered no intraoperative or immediate postoperative complications.  Audrie Blind, M.D.

## 2024-04-06 NOTE — Interval H&P Note (Signed)
 History and Physical Interval Note:  04/06/2024 7:33 AM  Sierra Pearson  has presented today for surgery, with the diagnosis of PARTIAL LEFT FOURTH NERVE PALSY AND EXOTROPIA.  The various methods of treatment have been discussed with the patient and family. After consideration of risks, benefits and other options for treatment, the patient has consented to  Procedure(s): REPAIR STRABISMUS (Left) as a surgical intervention.  The patient's history has been reviewed, patient examined, no change in status, stable for surgery.  I have reviewed the patient's chart and labs.  Questions were answered to the patient's satisfaction.     [name]

## 2024-04-06 NOTE — Transfer of Care (Addendum)
 Immediate Anesthesia Transfer of Care Note  Patient: Sierra Pearson  Procedure(s) Performed: REPAIR STRABISMUS (Left: Eye)  Patient Location: PACU  Anesthesia Type:General  Level of Consciousness: awake, alert , and oriented  Airway & Oxygen Therapy: Patient Spontanous Breathing  Post-op Assessment: Report given to RN  Post vital signs: Reviewed and stable  Last Vitals:  Vitals Value Taken Time  BP 120/81 04/06/24 0850  Temp 37.5 04/06/24 0850  Pulse 75 04/06/24 0854  Resp 12 04/06/24 0854  SpO2 98 % 04/06/24 0854  Vitals shown include unfiled device data.  Last Pain:  Vitals:   04/06/24 0850  TempSrc:   PainSc: 0-No pain         Complications: No notable events documented.

## 2024-04-06 NOTE — Discharge Instructions (Signed)
 Diet: Clear liquids, advance to soft foods then regular diet as tolerated.  Pain control: Overlapping ibuprofen (aka Advil, Motrin) with acetaminophen (aka Tylenol, Excedrin) has been clinically proven as effective for pain as morphine.  1)  Ibuprofen 600 mg by mouth every 6 hours as needed for pain   Do not take this medication if you already take NSAIDs (such as naproxen/Aleve or Surveyor, quantity).  2)  Acetaminophen 325 one or two by mouth every 4-6 hours as needed for pain that is not resolved by ibuprofen   Do not take this medication if you already take acetaminophen within another medication (such as Percocet/Lortab).  Eye medications:  Antibiotic eye drops or ointment, one drop or application in the operated eye(s) 4 times a day for 7 days.    Activity: No swimming for 1 week. It is OK to let water run over the face and eyes while showering or taking a bath, even during the first week.  No other restriction on exercise or activity.  Eye movement: The eyes may look very slightly crossed in or turned out, and you may experience temporary double vision (diplopia). This is not unusual postoperatively and may happen up to two months after surgery while the muscles are healing. The eyes may be tired during the first few weeks after surgery; reading can be uncomfortable during the healing process but will not hurt the eyes.  Call Dr. Eliane Decree office [phone] with any problems or concerns.

## 2024-04-07 ENCOUNTER — Encounter (HOSPITAL_COMMUNITY): Payer: Self-pay | Admitting: Ophthalmology
